# Patient Record
Sex: Female | Born: 1975
Health system: Southern US, Community
[De-identification: ages and names within clinical notes are randomized; demographics above are authoritative.]

## PROBLEM LIST (undated history)

## (undated) DIAGNOSIS — R51 Headache: Secondary | ICD-10-CM

## (undated) DIAGNOSIS — I2699 Other pulmonary embolism without acute cor pulmonale: Secondary | ICD-10-CM

## (undated) DIAGNOSIS — F329 Major depressive disorder, single episode, unspecified: Secondary | ICD-10-CM

## (undated) DIAGNOSIS — F32A Depression, unspecified: Secondary | ICD-10-CM

## (undated) DIAGNOSIS — J45909 Unspecified asthma, uncomplicated: Secondary | ICD-10-CM

## (undated) DIAGNOSIS — Z87442 Personal history of urinary calculi: Secondary | ICD-10-CM

## (undated) DIAGNOSIS — F419 Anxiety disorder, unspecified: Secondary | ICD-10-CM

## (undated) DIAGNOSIS — I82402 Acute embolism and thrombosis of unspecified deep veins of left lower extremity: Secondary | ICD-10-CM

## (undated) HISTORY — PX: OTHER SURGICAL HISTORY: SHX169

## (undated) HISTORY — PX: WISDOM TOOTH EXTRACTION: SHX21

---

## 1998-06-03 ENCOUNTER — Encounter: Admission: RE | Admit: 1998-06-03 | Discharge: 1998-09-01 | Payer: Self-pay | Admitting: Obstetrics and Gynecology

## 1999-05-13 ENCOUNTER — Other Ambulatory Visit: Admission: RE | Admit: 1999-05-13 | Discharge: 1999-05-13 | Payer: Self-pay | Admitting: Obstetrics and Gynecology

## 1999-10-12 ENCOUNTER — Encounter: Admission: RE | Admit: 1999-10-12 | Discharge: 1999-10-12 | Payer: Self-pay | Admitting: Obstetrics and Gynecology

## 2000-01-17 ENCOUNTER — Emergency Department (HOSPITAL_COMMUNITY): Admission: EM | Admit: 2000-01-17 | Discharge: 2000-01-17 | Payer: Self-pay | Admitting: Emergency Medicine

## 2000-03-21 ENCOUNTER — Inpatient Hospital Stay (HOSPITAL_COMMUNITY): Admission: AD | Admit: 2000-03-21 | Discharge: 2000-03-21 | Payer: Self-pay | Admitting: Obstetrics and Gynecology

## 2000-05-11 ENCOUNTER — Encounter: Admission: RE | Admit: 2000-05-11 | Discharge: 2000-05-11 | Payer: Self-pay | Admitting: Obstetrics and Gynecology

## 2000-05-11 ENCOUNTER — Encounter: Payer: Self-pay | Admitting: Obstetrics and Gynecology

## 2000-06-24 ENCOUNTER — Other Ambulatory Visit: Admission: RE | Admit: 2000-06-24 | Discharge: 2000-06-24 | Payer: Self-pay | Admitting: Obstetrics and Gynecology

## 2000-07-22 ENCOUNTER — Emergency Department (HOSPITAL_COMMUNITY): Admission: EM | Admit: 2000-07-22 | Discharge: 2000-07-22 | Payer: Self-pay | Admitting: Emergency Medicine

## 2000-07-22 ENCOUNTER — Encounter: Payer: Self-pay | Admitting: Emergency Medicine

## 2002-10-19 ENCOUNTER — Other Ambulatory Visit: Admission: RE | Admit: 2002-10-19 | Discharge: 2002-10-19 | Payer: Self-pay | Admitting: Obstetrics & Gynecology

## 2003-11-07 ENCOUNTER — Other Ambulatory Visit: Admission: RE | Admit: 2003-11-07 | Discharge: 2003-11-07 | Payer: Self-pay | Admitting: Obstetrics & Gynecology

## 2006-02-12 ENCOUNTER — Emergency Department (HOSPITAL_COMMUNITY): Admission: EM | Admit: 2006-02-12 | Discharge: 2006-02-12 | Payer: Self-pay | Admitting: Family Medicine

## 2006-08-05 ENCOUNTER — Ambulatory Visit: Payer: Self-pay | Admitting: Internal Medicine

## 2007-02-01 ENCOUNTER — Ambulatory Visit: Payer: Self-pay | Admitting: Internal Medicine

## 2007-02-01 LAB — CONVERTED CEMR LAB
ALT: 15 units/L (ref 0–40)
AST: 22 units/L (ref 0–37)
Albumin: 3.5 g/dL (ref 3.5–5.2)
Alkaline Phosphatase: 60 units/L (ref 39–117)
BUN: 5 mg/dL — ABNORMAL LOW (ref 6–23)
Basophils Absolute: 0 10*3/uL (ref 0.0–0.1)
Basophils Relative: 0.5 % (ref 0.0–1.0)
Bilirubin, Direct: 0.1 mg/dL (ref 0.0–0.3)
CO2: 28 meq/L (ref 19–32)
Calcium: 9.4 mg/dL (ref 8.4–10.5)
Chloride: 107 meq/L (ref 96–112)
Cholesterol: 167 mg/dL (ref 0–200)
Creatinine, Ser: 0.9 mg/dL (ref 0.4–1.2)
Eosinophils Absolute: 0.1 10*3/uL (ref 0.0–0.6)
Eosinophils Relative: 1.5 % (ref 0.0–5.0)
GFR calc Af Amer: 93 mL/min
GFR calc non Af Amer: 77 mL/min
Glucose, Bld: 70 mg/dL (ref 70–99)
HCT: 43 % (ref 36.0–46.0)
HDL: 37.6 mg/dL — ABNORMAL LOW (ref >39.0)
Hemoglobin: 14.9 g/dL (ref 12.0–15.0)
LDL Cholesterol: 111 mg/dL — ABNORMAL HIGH (ref 0–99)
Lymphocytes Relative: 20.8 % (ref 12.0–46.0)
MCHC: 34.8 g/dL (ref 30.0–36.0)
MCV: 95.8 fL (ref 78.0–100.0)
Monocytes Absolute: 0.8 10*3/uL — ABNORMAL HIGH (ref 0.2–0.7)
Monocytes Relative: 12.7 % — ABNORMAL HIGH (ref 3.0–11.0)
Neutro Abs: 4.2 10*3/uL (ref 1.4–7.7)
Neutrophils Relative %: 64.5 % (ref 43.0–77.0)
Platelets: 227 10*3/uL (ref 150–400)
Potassium: 3.8 meq/L (ref 3.5–5.1)
RBC: 4.49 M/uL (ref 3.87–5.11)
RDW: 12.9 % (ref 11.5–14.6)
Sodium: 145 meq/L (ref 135–145)
TSH: 2.33 microintl units/mL (ref 0.35–5.50)
Total Bilirubin: 0.5 mg/dL (ref 0.3–1.2)
Total CHOL/HDL Ratio: 4.4
Total Protein: 7.1 g/dL (ref 6.0–8.3)
Triglycerides: 91 mg/dL (ref 0–149)
VLDL: 18 mg/dL (ref 0–40)
WBC: 6.4 10*3/uL (ref 4.5–10.5)

## 2007-02-08 ENCOUNTER — Ambulatory Visit: Payer: Self-pay | Admitting: Internal Medicine

## 2007-02-09 ENCOUNTER — Encounter: Admission: RE | Admit: 2007-02-09 | Discharge: 2007-05-10 | Payer: Self-pay | Admitting: Internal Medicine

## 2007-03-21 ENCOUNTER — Ambulatory Visit (HOSPITAL_COMMUNITY): Admission: RE | Admit: 2007-03-21 | Discharge: 2007-03-21 | Payer: Self-pay | Admitting: Surgery

## 2007-03-27 ENCOUNTER — Ambulatory Visit (HOSPITAL_COMMUNITY): Admission: RE | Admit: 2007-03-27 | Discharge: 2007-03-27 | Payer: Self-pay | Admitting: Surgery

## 2007-04-10 ENCOUNTER — Ambulatory Visit: Payer: Self-pay | Admitting: Internal Medicine

## 2007-05-12 ENCOUNTER — Emergency Department (HOSPITAL_COMMUNITY): Admission: EM | Admit: 2007-05-12 | Discharge: 2007-05-12 | Payer: Self-pay | Admitting: Emergency Medicine

## 2007-05-24 ENCOUNTER — Encounter: Admission: RE | Admit: 2007-05-24 | Discharge: 2007-05-24 | Payer: Self-pay | Admitting: Orthopaedic Surgery

## 2007-08-31 DIAGNOSIS — R51 Headache: Secondary | ICD-10-CM

## 2007-08-31 DIAGNOSIS — R519 Headache, unspecified: Secondary | ICD-10-CM | POA: Insufficient documentation

## 2007-08-31 DIAGNOSIS — F329 Major depressive disorder, single episode, unspecified: Secondary | ICD-10-CM

## 2007-09-18 ENCOUNTER — Encounter: Admission: RE | Admit: 2007-09-18 | Discharge: 2007-12-17 | Payer: Self-pay | Admitting: Internal Medicine

## 2007-09-22 ENCOUNTER — Encounter: Payer: Self-pay | Admitting: Internal Medicine

## 2007-10-02 ENCOUNTER — Ambulatory Visit (HOSPITAL_COMMUNITY): Admission: RE | Admit: 2007-10-02 | Discharge: 2007-10-03 | Payer: Self-pay | Admitting: Surgery

## 2007-10-27 DIAGNOSIS — J069 Acute upper respiratory infection, unspecified: Secondary | ICD-10-CM | POA: Insufficient documentation

## 2007-11-03 ENCOUNTER — Ambulatory Visit: Payer: Self-pay | Admitting: Family Medicine

## 2007-11-03 DIAGNOSIS — H66009 Acute suppurative otitis media without spontaneous rupture of ear drum, unspecified ear: Secondary | ICD-10-CM | POA: Insufficient documentation

## 2008-01-01 ENCOUNTER — Encounter: Admission: RE | Admit: 2008-01-01 | Discharge: 2008-01-01 | Payer: Self-pay | Admitting: Internal Medicine

## 2008-07-04 ENCOUNTER — Ambulatory Visit: Payer: Self-pay | Admitting: *Deleted

## 2008-08-06 ENCOUNTER — Telehealth: Payer: Self-pay | Admitting: Internal Medicine

## 2008-08-06 ENCOUNTER — Ambulatory Visit: Payer: Self-pay | Admitting: Internal Medicine

## 2008-08-06 DIAGNOSIS — J0110 Acute frontal sinusitis, unspecified: Secondary | ICD-10-CM | POA: Insufficient documentation

## 2008-08-15 ENCOUNTER — Telehealth: Payer: Self-pay | Admitting: Internal Medicine

## 2008-11-01 ENCOUNTER — Emergency Department (HOSPITAL_COMMUNITY): Admission: EM | Admit: 2008-11-01 | Discharge: 2008-11-01 | Payer: Self-pay | Admitting: Emergency Medicine

## 2011-01-17 ENCOUNTER — Encounter: Payer: Self-pay | Admitting: Surgery

## 2011-02-25 DIAGNOSIS — I82402 Acute embolism and thrombosis of unspecified deep veins of left lower extremity: Secondary | ICD-10-CM

## 2011-02-25 HISTORY — DX: Acute embolism and thrombosis of unspecified deep veins of left lower extremity: I82.402

## 2011-05-11 NOTE — Op Note (Signed)
Stacey Myers, ROSASCO                   ACCOUNT NO.:  [id number]   MEDICAL RECORD NO.:  [id number]          PATIENT TYPE:  OIB   LOCATION:  0098                         FACILITY:  [facility]   PHYSICIAN:  Sandria Bales. Ezzard Standing, M.D.  DATE OF BIRTH:  09/07/1976   DATE OF PROCEDURE:  10/02/2007  DATE OF DISCHARGE:                               OPERATIVE REPORT   PREOPERATIVE DIAGNOSIS:  Morbid obesity (weight 268, body mass index  42.1).   POSTOPERATIVE DIAGNOSIS:  Morbid obesity (weight 268, body mass index  42.1).   PROCEDURE:  A lap band with APS standard band.   SURGEON:  Sandria Bales. Ezzard Standing, M.D.   FIRST ASSISTANT:  Dr. Jaclynn Guarneri.   ANESTHESIA:  General endotracheal.   BLOOD LOSS:  Minimal.   COMPLICATIONS:  None.   PROCEDURE:  The patient is a 35 year old white female who sees Bruce H.  Swords, MD, as her primary medical physician and Marlinda Mike at  [facility].   She has been morbidly obese much of her adult life, has been through our  preop bariatric program and now comes for attempted laparoscopic  banding.   Indications, potential complications explained to the patient.  Potential complications include but not limited to bleeding, infection,  bowel injury and long-term nutritional consequences.   OPERATIVE NOTE:  Patient placed in supine position, given 1 gram of  Ancef at the initiation of the procedure.  She had PAS stockings in  place.  Her abdomen was prepped with Betadine solution and sterilely  draped.   I accessed the abdomen with an OptiVu in the left upper quadrant and  insufflated the abdomen.   I carried out a laparoscopic exploration which were right left lobes of  liver unremarkable.  The stomach was unremarkable.  The bowel that I  could see was unremarkable.  I then placed five additional trocars, a 5  mm subxiphoid port, a 15 mm right subcostal port, a 12 mm right  paramedian port, a 12 mm left paramedian port and a 5 mm left lateral  abdominal  port.   I then placed the Nathanson retractor through the subxiphoid port and  retracted the left lobe of the liver.   I then started my dissection up at the gastroesophageal junction.  She  had really no external evidence of a hiatal hernia.  I expose the angle  of Hiss.  I then went through the gastrohepatic ligament and identified  the right crus and passed the finger dissector behind the stomach up to  the angle of Hiss.  I then placed the band around the proximal stomach,  a sizer was passed down the esophagus into the stomach and I cinched the  APS band around the sizer easily.   I then removed the sizer.  I imbricated the stomach over the band with  three sutures of 0 Ethibond suture bringing the stomach up over the band  to attach to the stomach right at the GE junction.   The band seated well.  There was no bleeding or evidence of injury to  the stomach.  I then removed the Silastic tubing through the right  paramedian port and all of the other ports were removed under direct  visualization without any evidence of bleeding.  The Nathanson retractor  had already been removed.   I then enlarged the right paramedian incision and cut down to the  abdominal wall.  I attached the Silastic tubing to the reservoir device  and then sewed the reservoir into the anterior abdominal wall using 0  Prolene sutures.   The wound was then irrigated.  The subcutaneous tissues closed with 3-0  Vicryl suture.  The skin site was closed with a 5-0 Monocryl suture and  then Dermabond was placed on the incisions.   The patient has tolerated the procedure well, was transported to the  recovery room in good condition.  Sponge and needle count were correct  at the end of the case.      Sandria Bales. Ezzard Standing, M.D.  Electronically Signed     DHN/MEDQ  D:  10/02/2007  T:  10/02/2007  Job:  [id number]   cc:   Marlinda Mike, C.N.M.   Bruce Rexene Edison Swords, MD  [address] [place]  [place]  [address]

## 2011-10-07 LAB — BASIC METABOLIC PANEL
CO2: 23
Chloride: 108
GFR calc Af Amer: >60
Glucose, Bld: 90
Sodium: 139

## 2011-10-07 LAB — DIFFERENTIAL
Basophils Absolute: 0
Lymphocytes Relative: 15
Lymphs Abs: 1.8
Monocytes Absolute: 0.8 — ABNORMAL HIGH
Monocytes Relative: 7
Neutro Abs: 9 — ABNORMAL HIGH

## 2011-10-07 LAB — CBC
Hemoglobin: 13.6
RBC: 4.08
WBC: 11.7 — ABNORMAL HIGH

## 2011-10-07 LAB — PREGNANCY, URINE: Preg Test, Ur: NEGATIVE

## 2011-10-07 LAB — HEMOGLOBIN AND HEMATOCRIT, BLOOD: HCT: 42.3

## 2012-05-04 ENCOUNTER — Ambulatory Visit
Admission: RE | Admit: 2012-05-04 | Discharge: 2012-05-04 | Disposition: A | Discharge status: Home / Self Care | Payer: Medicaid Other | Source: Ambulatory Visit | Attending: Family Medicine | Admitting: Family Medicine

## 2012-05-04 ENCOUNTER — Other Ambulatory Visit: Payer: Self-pay | Admitting: Family Medicine

## 2012-05-04 DIAGNOSIS — M25569 Pain in unspecified knee: Secondary | ICD-10-CM

## 2012-05-15 ENCOUNTER — Emergency Department (HOSPITAL_COMMUNITY)
Admission: EM | Admit: 2012-05-15 | Discharge: 2012-05-15 | Disposition: A | Discharge status: Home / Self Care | Payer: Self-pay | Source: Home / Self Care | Attending: Emergency Medicine | Admitting: Emergency Medicine

## 2012-05-15 ENCOUNTER — Encounter (HOSPITAL_COMMUNITY): Payer: Self-pay

## 2012-05-15 DIAGNOSIS — R05 Cough: Secondary | ICD-10-CM

## 2012-05-15 HISTORY — DX: Other pulmonary embolism without acute cor pulmonale: I26.99

## 2012-05-15 HISTORY — DX: Acute embolism and thrombosis of unspecified deep veins of left lower extremity: I82.402

## 2012-05-15 MED ORDER — PREDNISONE 20 MG PO TABS: 40.0000 mg | ORAL_TABLET | Freq: Every day | ORAL | Status: AC

## 2012-05-15 MED ORDER — ALBUTEROL SULFATE HFA 108 (90 BASE) MCG/ACT IN AERS: 1.0000 | INHALATION_SPRAY | Freq: Four times a day (QID) | RESPIRATORY_TRACT | Status: DC | PRN

## 2012-05-15 NOTE — ED Provider Notes (Addendum)
History     CSN: [id number]  Arrival date & time 05/15/12  1109   First MD Initiated Contact with Patient 05/15/12 1116      Chief Complaint  Patient presents with  . URI    (Consider location/radiation/quality/duration/timing/severity/associated sxs/prior treatment) HPI Comments: Patient presents urgent care she's been coughing for 3 days feels congested. No fevers and no shortness of breath. Cough is constant occasionally see some phlegm but most of time is dry in his bothersome at this point. Patient denies any chest pains, or shortness of breath or wheezing. Does feel some nasal congestion. Some discomfort in her throat thinks that is secondary to coughing.  Patient is a 36 y.o. female presenting with URI. The history is provided by the patient.  URI The primary symptoms include sore throat and cough. Primary symptoms do not include fatigue, headaches, ear pain, swollen glands, wheezing, nausea, vomiting, myalgias, arthralgias or rash.  Symptoms associated with the illness include facial pain and rhinorrhea. The illness is not associated with chills.    Past Medical History  Diagnosis Date  . Pulmonary embolism   . Deep vein blood clot of left lower extremity     History reviewed. No pertinent past surgical history.  History reviewed. No pertinent family history.  History  Substance Use Topics  . Smoking status: Unknown If Ever Smoked  . Smokeless tobacco: Not on file  . Alcohol Use: No    OB History    Grav Para Term Preterm Abortions TAB SAB Ect Mult Living                  Review of Systems  Constitutional: Negative for chills, activity change, appetite change and fatigue.  HENT: Positive for sore throat and rhinorrhea. Negative for ear pain.   Respiratory: Positive for cough. Negative for shortness of breath and wheezing.   Gastrointestinal: Negative for nausea and vomiting.  Musculoskeletal: Negative for myalgias and arthralgias.  Skin: Negative for rash.    Neurological: Negative for headaches.    Allergies  Azithromycin; Erythromycin; Penicillins; and Sulfamethoxazole w-trimethoprim  Home Medications   Current Outpatient Rx  Name Route Sig Dispense Refill  . WARFARIN SODIUM 5 MG PO TABS Oral Take 5 mg by mouth daily.    . ALBUTEROL SULFATE HFA 108 (90 BASE) MCG/ACT IN AERS Inhalation Inhale 1-2 puffs into the lungs every 6 (six) hours as needed for wheezing. 1 Inhaler 0  . PREDNISONE 20 MG PO TABS Oral Take 2 tablets (40 mg total) by mouth daily. 2 tablets daily for 5 days 10 tablet 0    LMP 04/19/2012  Physical Exam  Nursing note and vitals reviewed. Constitutional: She is oriented to person, place, and time. She appears well-developed and well-nourished.  Non-toxic appearance. She does not have a sickly appearance. She does not appear ill. No distress.  HENT:  Head: Normocephalic.  Right Ear: Tympanic membrane normal.  Left Ear: Tympanic membrane normal.  Mouth/Throat: Uvula is midline, oropharynx is clear and moist and mucous membranes are normal.  Eyes: Conjunctivae are normal. Right eye exhibits no discharge. Left eye exhibits no discharge.  Neck: Neck supple. No JVD present.  Cardiovascular: Normal rate.   Pulmonary/Chest: Effort normal. No respiratory distress. She has no decreased breath sounds. She has no wheezes. She has no rales. She exhibits no tenderness.  Abdominal: Soft.  Musculoskeletal: Normal range of motion.  Lymphadenopathy:    She has no cervical adenopathy.  Neurological: She is oriented to person, place, and time.  Skin: Skin is warm.    ED Course  Procedures (including critical care time)  Labs Reviewed - No data to display No results found.   1. Cough       MDM  Patient has been considering changing to a new primary care Dr. as she feels she wasn't discontinue Coumadin. She does currently to the [facility].       Jimmie Molly, MD 05/15/12 1535  Jimmie Molly, MD 05/15/12 1540

## 2012-05-15 NOTE — ED Notes (Signed)
Cold syx; seen by MD only

## 2012-05-15 NOTE — Discharge Instructions (Signed)
  Take these medicines for this reactive cough, along with an antihistamine such as Zyrtec, Allegra or Claritin.   Cough, Adult  A cough is a reflex. It helps you clear your throat and airways. A cough can help heal your body. A cough can last 2 or 3 weeks (acute) or may last more than 8 weeks (chronic). Some common causes of a cough can include an infection, allergy, or a cold. HOME CARE  Only take medicine as told by your doctor.   If given, take your medicines (antibiotics) as told. Finish them even if you start to feel better.   Use a cold steam vaporizer or humidier in your home. This can help loosen thick spit (secretions).   Sleep so you are almost sitting up (semi-upright). Use pillows to do this. This helps reduce coughing.   Rest as needed.   Stop smoking if you smoke.  GET HELP RIGHT AWAY IF:  You have yellowish-white fluid (pus) in your thick spit.   Your cough gets worse.   Your medicine does not reduce coughing, and you are losing sleep.   You cough up blood.   You have trouble breathing.   Your pain gets worse and medicine does not help.   You have a fever.  MAKE SURE YOU:   Understand these instructions.   Will watch your condition.   Will get help right away if you are not doing well or get worse.  Document Released: 08/26/2011 Document Revised: 12/02/2011 Document Reviewed: 08/26/2011 [facility] Patient Information 2012 [place], [place].

## 2012-08-09 ENCOUNTER — Telehealth: Payer: Self-pay | Admitting: *Deleted

## 2012-08-09 NOTE — Telephone Encounter (Signed)
patient stated on 08-09-2012 that she will be going to [facility] she found a hemtaologist [place] [address] [place]

## 2012-09-26 ENCOUNTER — Encounter (HOSPITAL_COMMUNITY): Payer: Self-pay | Admitting: Pharmacist

## 2012-10-03 ENCOUNTER — Other Ambulatory Visit: Payer: Self-pay | Admitting: Obstetrics & Gynecology

## 2012-10-03 ENCOUNTER — Encounter (HOSPITAL_COMMUNITY): Payer: Self-pay

## 2012-10-03 ENCOUNTER — Encounter (HOSPITAL_COMMUNITY)
Admission: RE | Admit: 2012-10-03 | Discharge: 2012-10-03 | Disposition: A | Discharge status: Home / Self Care | Payer: BC Managed Care – PPO | Source: Ambulatory Visit | Attending: Obstetrics & Gynecology | Admitting: Obstetrics & Gynecology

## 2012-10-03 DIAGNOSIS — Z01818 Encounter for other preprocedural examination: Secondary | ICD-10-CM | POA: Insufficient documentation

## 2012-10-03 DIAGNOSIS — Z01812 Encounter for preprocedural laboratory examination: Secondary | ICD-10-CM | POA: Insufficient documentation

## 2012-10-03 HISTORY — DX: Major depressive disorder, single episode, unspecified: F32.9

## 2012-10-03 HISTORY — DX: Anxiety disorder, unspecified: F41.9

## 2012-10-03 HISTORY — DX: Unspecified asthma, uncomplicated: J45.909

## 2012-10-03 HISTORY — DX: Depression, unspecified: F32.A

## 2012-10-03 HISTORY — DX: Headache: R51

## 2012-10-03 LAB — CBC
HCT: 41.6 % (ref 36.0–46.0)
Hemoglobin: 13.9 g/dL (ref 12.0–15.0)
MCV: 93.7 fL (ref 78.0–100.0)
Platelets: 220 10*3/uL (ref 150–400)
RBC: 4.44 MIL/uL (ref 3.87–5.11)
WBC: 7.2 10*3/uL (ref 4.0–10.5)

## 2012-10-03 NOTE — Patient Instructions (Addendum)
   Your procedure is scheduled on:  Thursday, Oct. 17   Enter through the [employer] of [facility] at:  1015 am Pick up the phone at the desk and dial [phone] and inform us of your arrival.  Please call this number if you have any problems the morning of surgery: [phone]  Remember: Do not eat food after midnight: Wednesday Do not drink clear liquids after:730 am Thursday Take these medicines the morning of surgery with a SIP OF WATER:  None.  Patient to bring rescue inhaler with her on day of surgery.  Per patient, Dr Seymour Bars instructed patient to stop Coumadin on Wed, 10/11/12.   Do not wear jewelry, make-up, or FINGER nail polish No metal in your hair or on your body. Do not wear lotions, powders, perfumes. You may wear deodorant.  Please use your CHG wash as directed prior to surgery.  Do not shave anywhere for at least 12 hours prior to first CHG shower.  Do not bring valuables to the hospital. Contacts, dentures or bridgework may not be worn into surgery.  Patients discharged on the day of surgery will not be allowed to drive home.   Home with mother Ripley Lovecchio.

## 2012-10-12 ENCOUNTER — Ambulatory Visit (HOSPITAL_COMMUNITY)
Admission: RE | Admit: 2012-10-12 | Payer: BC Managed Care – PPO | Source: Ambulatory Visit | Admitting: Obstetrics & Gynecology

## 2012-10-12 ENCOUNTER — Encounter (HOSPITAL_COMMUNITY): Admission: RE | Payer: Self-pay | Source: Ambulatory Visit

## 2012-10-12 SURGERY — DILATATION & CURETTAGE/HYSTEROSCOPY WITH NOVASURE ABLATION
Anesthesia: Choice

## 2013-04-30 ENCOUNTER — Encounter (HOSPITAL_COMMUNITY)
Admission: RE | Admit: 2013-04-30 | Discharge: 2013-04-30 | Disposition: A | Discharge status: Home / Self Care | Payer: BC Managed Care – PPO | Source: Ambulatory Visit | Attending: Obstetrics & Gynecology | Admitting: Obstetrics & Gynecology

## 2013-04-30 ENCOUNTER — Encounter (HOSPITAL_COMMUNITY): Payer: Self-pay

## 2013-04-30 HISTORY — DX: Personal history of urinary calculi: Z87.442

## 2013-04-30 LAB — CBC
HCT: 41.5 % (ref 36.0–46.0)
Hemoglobin: 14.4 g/dL (ref 12.0–15.0)
MCHC: 34.7 g/dL (ref 30.0–36.0)

## 2013-04-30 LAB — SURGICAL PCR SCREEN: Staphylococcus aureus: NEGATIVE

## 2013-04-30 NOTE — Patient Instructions (Signed)
Your procedure is scheduled on:05/04/13  Enter through the Main Entrance at :0730 am  Pick up desk phone and dial [phone] and inform us of your arrival.  Please call [phone] if you have any problems the morning of surgery.  Remember: Do not eat or drink after midnight:Thursday  Bring inhaler to hospital  DO NOT wear jewelry, eye make-up, lipstick,body lotion, or dark fingernail polish.   If you are to be admitted after surgery, leave suitcase in car until your room has been assigned. Patients discharged on the day of surgery will not be allowed to drive home.

## 2013-05-01 ENCOUNTER — Other Ambulatory Visit: Payer: Self-pay | Admitting: Obstetrics & Gynecology

## 2013-05-03 MED ORDER — GENTAMICIN SULFATE 40 MG/ML IJ SOLN
INTRAVENOUS | Status: AC
  Administered 2013-05-04: 100 mL via INTRAVENOUS
  Filled 2013-05-03: qty 9.5

## 2013-05-04 ENCOUNTER — Encounter (HOSPITAL_COMMUNITY): Payer: Self-pay | Admitting: Anesthesiology

## 2013-05-04 ENCOUNTER — Encounter (HOSPITAL_COMMUNITY)
Admission: RE | Disposition: A | Discharge status: Home / Self Care | Payer: Self-pay | Source: Ambulatory Visit | Attending: Obstetrics & Gynecology

## 2013-05-04 ENCOUNTER — Ambulatory Visit (HOSPITAL_COMMUNITY)
Admission: RE | Admit: 2013-05-04 | Discharge: 2013-05-04 | Disposition: A | Discharge status: Home / Self Care | Payer: BC Managed Care – PPO | Source: Ambulatory Visit | Attending: Obstetrics & Gynecology | Admitting: Obstetrics & Gynecology

## 2013-05-04 ENCOUNTER — Encounter (HOSPITAL_COMMUNITY): Payer: Self-pay | Admitting: *Deleted

## 2013-05-04 ENCOUNTER — Ambulatory Visit (HOSPITAL_COMMUNITY): Payer: BC Managed Care – PPO | Admitting: Anesthesiology

## 2013-05-04 ENCOUNTER — Encounter (HOSPITAL_COMMUNITY): Payer: Self-pay | Admitting: Pharmacy Technician

## 2013-05-04 DIAGNOSIS — Z86718 Personal history of other venous thrombosis and embolism: Secondary | ICD-10-CM | POA: Insufficient documentation

## 2013-05-04 DIAGNOSIS — Z86711 Personal history of pulmonary embolism: Secondary | ICD-10-CM | POA: Insufficient documentation

## 2013-05-04 DIAGNOSIS — Z302 Encounter for sterilization: Secondary | ICD-10-CM | POA: Insufficient documentation

## 2013-05-04 DIAGNOSIS — N920 Excessive and frequent menstruation with regular cycle: Secondary | ICD-10-CM | POA: Insufficient documentation

## 2013-05-04 HISTORY — PX: LAPAROSCOPIC TUBAL LIGATION: SHX1937

## 2013-05-04 HISTORY — PX: DILITATION & CURRETTAGE/HYSTROSCOPY WITH NOVASURE ABLATION: SHX5568

## 2013-05-04 LAB — CBC
HCT: 37.7 % (ref 36.0–46.0)
Hemoglobin: 12.9 g/dL (ref 12.0–15.0)
MCH: 31.9 pg (ref 26.0–34.0)
MCHC: 34.2 g/dL (ref 30.0–36.0)
MCV: 93.1 fL (ref 78.0–100.0)

## 2013-05-04 LAB — COMPREHENSIVE METABOLIC PANEL
BUN: 8 mg/dL (ref 6–23)
Calcium: 8.5 mg/dL (ref 8.4–10.5)
Creatinine, Ser: 0.75 mg/dL (ref 0.50–1.10)
GFR calc Af Amer: >90 mL/min (ref >90)
Glucose, Bld: 92 mg/dL (ref 70–99)
Sodium: 138 mEq/L (ref 135–145)
Total Protein: 6.3 g/dL (ref 6.0–8.3)

## 2013-05-04 LAB — PREGNANCY, URINE: Preg Test, Ur: NEGATIVE

## 2013-05-04 LAB — PROTIME-INR
INR: 0.97 (ref 0.00–1.49)
Prothrombin Time: 12.8 seconds (ref 11.6–15.2)

## 2013-05-04 LAB — TYPE AND SCREEN: ABO/RH(D): A NEG

## 2013-05-04 SURGERY — DILATATION & CURETTAGE/HYSTEROSCOPY WITH NOVASURE ABLATION
Anesthesia: General | Site: Vagina | Wound class: Clean Contaminated

## 2013-05-04 MED ORDER — MIDAZOLAM HCL 2 MG/2ML IJ SOLN: 0.5000 mg | Freq: Once | INTRAMUSCULAR | Status: DC | PRN

## 2013-05-04 MED ORDER — GLYCOPYRROLATE 0.2 MG/ML IJ SOLN
INTRAMUSCULAR | Status: AC
  Filled 2013-05-04: qty 4

## 2013-05-04 MED ORDER — LACTATED RINGERS IV SOLN
Freq: Once | INTRAVENOUS | Status: AC
  Administered 2013-05-04: 50 mL/h via INTRAVENOUS

## 2013-05-04 MED ORDER — PROPOFOL 10 MG/ML IV BOLUS
INTRAVENOUS | Status: DC | PRN
  Administered 2013-05-04: 170 mg via INTRAVENOUS

## 2013-05-04 MED ORDER — LIDOCAINE HCL (CARDIAC) 20 MG/ML IV SOLN
INTRAVENOUS | Status: AC
  Filled 2013-05-04: qty 5

## 2013-05-04 MED ORDER — MEPERIDINE HCL 25 MG/ML IJ SOLN: 6.2500 mg | INTRAMUSCULAR | Status: DC | PRN

## 2013-05-04 MED ORDER — ROCURONIUM BROMIDE 100 MG/10ML IV SOLN
INTRAVENOUS | Status: DC | PRN
  Administered 2013-05-04: 30 mg via INTRAVENOUS
  Administered 2013-05-04: 5 mg via INTRAVENOUS

## 2013-05-04 MED ORDER — KETOROLAC TROMETHAMINE 30 MG/ML IJ SOLN: 15.0000 mg | Freq: Once | INTRAMUSCULAR | Status: AC | PRN

## 2013-05-04 MED ORDER — ROCURONIUM BROMIDE 50 MG/5ML IV SOLN
INTRAVENOUS | Status: AC
  Filled 2013-05-04: qty 1

## 2013-05-04 MED ORDER — FENTANYL CITRATE 0.05 MG/ML IJ SOLN
INTRAMUSCULAR | Status: AC
  Filled 2013-05-04: qty 5

## 2013-05-04 MED ORDER — OXYCODONE-ACETAMINOPHEN 7.5-325 MG PO TABS: 1.0000 | ORAL_TABLET | ORAL | Status: DC | PRN

## 2013-05-04 MED ORDER — HEPARIN SODIUM (PORCINE) 5000 UNIT/ML IJ SOLN
INTRAMUSCULAR | Status: AC
  Administered 2013-05-04: 5000 [IU] via SUBCUTANEOUS
  Filled 2013-05-04: qty 1

## 2013-05-04 MED ORDER — MIDAZOLAM HCL 5 MG/5ML IJ SOLN
INTRAMUSCULAR | Status: DC | PRN
  Administered 2013-05-04: 2 mg via INTRAVENOUS

## 2013-05-04 MED ORDER — BUPIVACAINE HCL (PF) 0.25 % IJ SOLN
INTRAMUSCULAR | Status: AC
  Filled 2013-05-04: qty 30

## 2013-05-04 MED ORDER — DEXAMETHASONE SODIUM PHOSPHATE 10 MG/ML IJ SOLN
INTRAMUSCULAR | Status: AC
  Filled 2013-05-04: qty 1

## 2013-05-04 MED ORDER — NEOSTIGMINE METHYLSULFATE 1 MG/ML IJ SOLN
INTRAMUSCULAR | Status: AC
  Filled 2013-05-04: qty 1

## 2013-05-04 MED ORDER — KETOROLAC TROMETHAMINE 30 MG/ML IJ SOLN
INTRAMUSCULAR | Status: AC
  Administered 2013-05-04: 30 mg via INTRAVENOUS
  Filled 2013-05-04: qty 1

## 2013-05-04 MED ORDER — CHLOROPROCAINE HCL 1 % IJ SOLN
INTRAMUSCULAR | Status: AC
  Filled 2013-05-04: qty 30

## 2013-05-04 MED ORDER — GLYCOPYRROLATE 0.2 MG/ML IJ SOLN
INTRAMUSCULAR | Status: AC
  Filled 2013-05-04: qty 2

## 2013-05-04 MED ORDER — ONDANSETRON HCL 4 MG/2ML IJ SOLN
INTRAMUSCULAR | Status: DC | PRN
  Administered 2013-05-04: 4 mg via INTRAVENOUS

## 2013-05-04 MED ORDER — MIDAZOLAM HCL 2 MG/2ML IJ SOLN
INTRAMUSCULAR | Status: AC
  Filled 2013-05-04: qty 2

## 2013-05-04 MED ORDER — ONDANSETRON HCL 4 MG/2ML IJ SOLN
INTRAMUSCULAR | Status: AC
  Filled 2013-05-04: qty 2

## 2013-05-04 MED ORDER — GLYCOPYRROLATE 0.2 MG/ML IJ SOLN
INTRAMUSCULAR | Status: DC | PRN
  Administered 2013-05-04: 0.1 mg via INTRAVENOUS
  Administered 2013-05-04: 0.6 mg via INTRAVENOUS

## 2013-05-04 MED ORDER — PROPOFOL 10 MG/ML IV EMUL
INTRAVENOUS | Status: AC
  Filled 2013-05-04: qty 20

## 2013-05-04 MED ORDER — LACTATED RINGERS IV SOLN
INTRAVENOUS | Status: DC | PRN
  Administered 2013-05-04 (×3): via INTRAVENOUS

## 2013-05-04 MED ORDER — PROMETHAZINE HCL 25 MG/ML IJ SOLN
INTRAMUSCULAR | Status: AC
  Administered 2013-05-04: 12.5 mg via INTRAVENOUS
  Filled 2013-05-04: qty 1

## 2013-05-04 MED ORDER — BUPIVACAINE HCL (PF) 0.25 % IJ SOLN
INTRAMUSCULAR | Status: DC | PRN
  Administered 2013-05-04: 5 mL

## 2013-05-04 MED ORDER — CHLOROPROCAINE HCL 1 % IJ SOLN
INTRAMUSCULAR | Status: DC | PRN
  Administered 2013-05-04: 20 mL

## 2013-05-04 MED ORDER — FENTANYL CITRATE 0.05 MG/ML IJ SOLN: 25.0000 ug | INTRAMUSCULAR | Status: DC | PRN

## 2013-05-04 MED ORDER — DEXAMETHASONE SODIUM PHOSPHATE 10 MG/ML IJ SOLN
INTRAMUSCULAR | Status: DC | PRN
  Administered 2013-05-04: 10 mg via INTRAVENOUS

## 2013-05-04 MED ORDER — FENTANYL CITRATE 0.05 MG/ML IJ SOLN
INTRAMUSCULAR | Status: DC | PRN
  Administered 2013-05-04: 50 ug via INTRAVENOUS
  Administered 2013-05-04: 100 ug via INTRAVENOUS
  Administered 2013-05-04 (×2): 50 ug via INTRAVENOUS

## 2013-05-04 MED ORDER — HEPARIN SODIUM (PORCINE) 5000 UNIT/ML IJ SOLN: 5000.0000 [IU] | INTRAMUSCULAR | Status: AC

## 2013-05-04 MED ORDER — PROPOFOL 10 MG/ML IV BOLUS: INTRAVENOUS | Status: DC | PRN

## 2013-05-04 MED ORDER — LIDOCAINE HCL (CARDIAC) 20 MG/ML IV SOLN
INTRAVENOUS | Status: DC | PRN
  Administered 2013-05-04: 80 mg via INTRAVENOUS

## 2013-05-04 MED ORDER — NEOSTIGMINE METHYLSULFATE 1 MG/ML IJ SOLN
INTRAMUSCULAR | Status: DC | PRN
  Administered 2013-05-04: 3 mg via INTRAVENOUS

## 2013-05-04 MED ORDER — PROMETHAZINE HCL 25 MG/ML IJ SOLN: 6.2500 mg | INTRAMUSCULAR | Status: DC | PRN

## 2013-05-04 SURGICAL SUPPLY — 23 items
ABLATOR ENDOMETRIAL BIPOLAR (ABLATOR) ×3 IMPLANT
APPLICATOR COTTON TIP 6IN STRL (MISCELLANEOUS) ×3 IMPLANT
CATH ROBINSON RED A/P 16FR (CATHETERS) ×3 IMPLANT
CLOTH BEACON ORANGE TIMEOUT ST (SAFETY) ×3 IMPLANT
CONTAINER PREFILL 10% NBF 60ML (FORM) ×3 IMPLANT
DRESSING TELFA 8X3 (GAUZE/BANDAGES/DRESSINGS) ×3 IMPLANT
ELECT REM PT RETURN 9FT ADLT (ELECTROSURGICAL) ×3
ELECTRODE REM PT RTRN 9FT ADLT (ELECTROSURGICAL) ×2 IMPLANT
GLOVE BIO SURGEON STRL SZ 6.5 (GLOVE) ×3 IMPLANT
GLOVE BIOGEL PI IND STRL 7.0 (GLOVE) ×2 IMPLANT
GLOVE BIOGEL PI INDICATOR 7.0 (GLOVE) ×1
GOWN PREVENTION PLUS LG XLONG (DISPOSABLE) ×6 IMPLANT
GOWN STRL REIN XL XLG (GOWN DISPOSABLE) ×9 IMPLANT
PACK HYSTEROSCOPY LF (CUSTOM PROCEDURE TRAY) ×3 IMPLANT
PACK LAPAROSCOPY BASIN (CUSTOM PROCEDURE TRAY) ×3 IMPLANT
PAD OB MATERNITY 4.3X12.25 (PERSONAL CARE ITEMS) ×3 IMPLANT
PENCIL BUTTON HOLSTER BLD 10FT (ELECTRODE) ×3 IMPLANT
SUT MNCRL AB 4-0 PS2 18 (SUTURE) ×3 IMPLANT
SUT VICRYL 0 UR6 27IN ABS (SUTURE) ×3 IMPLANT
TOWEL OR 17X24 6PK STRL BLUE (TOWEL DISPOSABLE) ×6 IMPLANT
TROCAR BALLN 12MMX100 BLUNT (TROCAR) ×3 IMPLANT
WARMER LAPAROSCOPE (MISCELLANEOUS) ×3 IMPLANT
WATER STERILE IRR 1000ML POUR (IV SOLUTION) ×3 IMPLANT

## 2013-05-04 NOTE — Discharge Summary (Signed)
  Physician Discharge Summary  Patient ID: Stacey Myers MRN: [id number] DOB/AGE: 1976/02/17 37 y.o.  Admit date: 05/04/2013 Discharge date: 05/04/2013  Admission Diagnoses: Desires Sterilization, Menorrhagia  [address], [phone]  Discharge Diagnoses: Desires Sterilization, Menorrhagia  82956, [phone]        Active Problems:   * No active hospital problems. *   Discharged Condition: good  Hospital Course: Outpatient  Consults: None  Treatments: surgery: D+C, Novasure endometrial ablation, LPS bilateral tubal sterilization by cautherization.  Disposition: 01-Home or Self Care     Medication List    TAKE these medications       ADULT GUMMY Chew  Chew 2 tablets by mouth daily.     aspirin 325 MG tablet  Take 325 mg by mouth daily.     Calcium Carb-Cholecalciferol 500-400 MG-UNIT Chew  Chew 2 each by mouth daily.     oxyCODONE-acetaminophen 7.5-325 MG per tablet  Commonly known as:  PERCOCET  Take 1 tablet by mouth every 4 (four) hours as needed for pain.           Follow-up Information   Follow up with Bobbijo Holst,MARIE-LYNE, MD In 3 weeks.   Contact information:   [address] [place] [address] [phone]       Signed: Genia Del, MD 05/04/2013, 11:51 AM

## 2013-05-04 NOTE — Transfer of Care (Signed)
Immediate Anesthesia Transfer of Care Note  Patient: Stacey Myers  Procedure(s) Performed: Procedure(s): DILATATION & CURETTAGE/HYSTEROSCOPY WITH NOVASURE ABLATION (N/A) LAPAROSCOPIC TUBAL LIGATION (Bilateral)  Patient Location: PACU  Anesthesia Type:General  Level of Consciousness: awake, alert  and oriented  Airway & Oxygen Therapy: Patient Spontanous Breathing and Patient connected to nasal cannula oxygen  Post-op Assessment: Report given to PACU RN and Post -op Vital signs reviewed and stable  Post vital signs: Reviewed and stable  Complications: No apparent anesthesia complications

## 2013-05-04 NOTE — H&P (Signed)
Stacey Myers is an 37 y.o. female   RP:  D+C Novasure endometrial ablation, LPS BT/S cautherization  Pertinent Gynecological History: Menses: flow is excessive with use of many pads or tampons on heaviest days Bleeding: no BTB Contraception: condoms Blood transfusions: none Sexually transmitted diseases: no past history  Last mammogram: none  Last pap: normal  Previous C/S   Menstrual History:  No LMP recorded.    Past Medical History  Diagnosis Date  . Pulmonary embolism   . Asthma     rarely use - rescue inhaler  . Anxiety     no meds currently  . Depression     no meds currently  . Deep vein blood clot of left lower extremity 02/2011    dvt and pulmonary embolism  . Headache   . History of kidney stones     Past Surgical History  Procedure Laterality Date  . Wisdom tooth extraction    . External fixature right arm    . Right wrist surgery    . Right knee surgery    . Cesarean section      x 1  . Lap band surgery      History reviewed. No pertinent family history.  Social History:  reports that she quit smoking about 20 years ago. Her smoking use included Cigarettes. She has a .1 pack-year smoking history. She has never used smokeless tobacco. She reports that  drinks alcohol. She reports that she does not use illicit drugs.  Allergies:  Allergies  Allergen Reactions  . Azithromycin Nausea And Vomiting  . Erythromycin Nausea And Vomiting  . Penicillins Rash  . Sulfamethoxazole W-Trimethoprim Rash    Prescriptions prior to admission  Medication Sig Dispense Refill  . aspirin 325 MG tablet Take 325 mg by mouth daily.      . Calcium Carb-Cholecalciferol 500-400 MG-UNIT CHEW Chew 2 each by mouth daily.       . Multiple Vitamins-Minerals (ADULT GUMMY) CHEW Chew 2 tablets by mouth daily.         Blood pressure 103/68, pulse 72, temperature 98.2 F (36.8 C), temperature source Oral, resp. rate 18, SpO2 98.00%.  Pelvic US Normal endometrial  line.  Results for orders placed during the hospital encounter of 05/04/13 (from the past 24 hour(s))  APTT     Status: None   Collection Time    05/04/13  7:45 AM      Result Value Range   aPTT 31  24 - 37 seconds  CBC     Status: None   Collection Time    05/04/13  7:45 AM      Result Value Range   WBC 7.2  4.0 - 10.5 K/uL   RBC 4.05  3.87 - 5.11 MIL/uL   Hemoglobin 12.9  12.0 - 15.0 g/dL   HCT 91.4  78.2 - 95.6 %   MCV 93.1  78.0 - 100.0 fL   MCH 31.9  26.0 - 34.0 pg   MCHC 34.2  30.0 - 36.0 g/dL   RDW 21.3  08.6 - 57.8 %   Platelets 209  150 - 400 K/uL  COMPREHENSIVE METABOLIC PANEL     Status: Abnormal   Collection Time    05/04/13  7:45 AM      Result Value Range   Sodium 138  135 - 145 mEq/L   Potassium 3.8  3.5 - 5.1 mEq/L   Chloride 105  96 - 112 mEq/L   CO2 26  19 - 32  mEq/L   Glucose, Bld 92  70 - 99 mg/dL   BUN 8  6 - 23 mg/dL   Creatinine, Ser 6.96  0.50 - 1.10 mg/dL   Calcium 8.5  8.4 - 29.5 mg/dL   Total Protein 6.3  6.0 - 8.3 g/dL   Albumin 3.1 (*) 3.5 - 5.2 g/dL   AST 15  0 - 37 U/L   ALT 13  0 - 35 U/L   Alkaline Phosphatase 54  39 - 117 U/L   Total Bilirubin 0.3  0.3 - 1.2 mg/dL   GFR calc non Af Amer >90  >90 mL/min   GFR calc Af Amer >90  >90 mL/min  PROTIME-INR     Status: None   Collection Time    05/04/13  7:45 AM      Result Value Range   Prothrombin Time 12.8  11.6 - 15.2 seconds   INR 0.97  0.00 - 1.49  PREGNANCY, URINE     Status: None   Collection Time    05/04/13  7:50 AM      Result Value Range   Preg Test, Ur NEGATIVE  NEGATIVE    No results found.  Assessment/Plan: Menorrhagia and Desire for Sterilization.  D+C, Novasure endometrial ablation, LPS BT/S cautherization.  Surgery and risks reviewed.  Stacey Myers,MARIE-LYNE 05/04/2013, 9:14 AM

## 2013-05-04 NOTE — Anesthesia Preprocedure Evaluation (Addendum)
Anesthesia Evaluation  Patient identified by MRN, date of birth, ID band Patient awake    Reviewed: Allergy & Precautions, H&P , Patient's Chart, lab work & pertinent test results, reviewed documented beta blocker date and time   History of Anesthesia Complications Negative for: history of anesthetic complications  Airway Mallampati: III TM Distance: >3 FB Neck ROM: full    Dental no notable dental hx.    Pulmonary neg pulmonary ROS, asthma ,  breath sounds clear to auscultation  Pulmonary exam normal       Cardiovascular Exercise Tolerance: Good + Peripheral Vascular Disease negative cardio ROS  Rhythm:regular Rate:Normal     Neuro/Psych  Headaches, PSYCHIATRIC DISORDERS Anxiety Depression negative neurological ROS  negative psych ROS   GI/Hepatic negative GI ROS, Neg liver ROS,   Endo/Other  negative endocrine ROSMorbid obesity  Renal/GU negative Renal ROS     Musculoskeletal   Abdominal   Peds  Hematology negative hematology ROS (+)   Anesthesia Other Findings Pulmonary embolism     Asthma   rarely use - rescue inhaler    Anxiety   no meds currently Depression   no meds currently    Deep vein blood clot of left lower extremity 02/2011 dvt and pulmonary embolism Headache        History of kidney stones      Reproductive/Obstetrics negative OB ROS                           Anesthesia Physical Anesthesia Plan  ASA: III  Anesthesia Plan: General   Post-op Pain Management:    Induction: Intravenous  Airway Management Planned: Oral ETT  Additional Equipment:   Intra-op Plan:   Post-operative Plan: Extubation in OR  Informed Consent: I have reviewed the patients History and Physical, chart, labs and discussed the procedure including the risks, benefits and alternatives for the proposed anesthesia with the patient or authorized representative who has indicated his/her understanding  and acceptance.   Dental Advisory Given  Plan Discussed with: CRNA, Surgeon and Anesthesiologist  Anesthesia Plan Comments:        Anesthesia Quick Evaluation

## 2013-05-04 NOTE — Anesthesia Postprocedure Evaluation (Signed)
Anesthesia Post Note  Patient: Stacey Myers  Procedure(s) Performed: Procedure(s) (LRB): DILATATION & CURETTAGE/HYSTEROSCOPY WITH NOVASURE ABLATION (N/A) LAPAROSCOPIC TUBAL LIGATION (Bilateral)  Anesthesia type: General  Patient location: PACU  Post pain: Pain level controlled  Post assessment: Post-op Vital signs reviewed  Last Vitals:  Filed Vitals:   05/04/13 1130  BP: 106/62  Pulse: 73  Temp:   Resp: 14    Post vital signs: Reviewed  Level of consciousness: sedated  Complications: No apparent anesthesia complications

## 2013-05-04 NOTE — Anesthesia Procedure Notes (Signed)
Procedure Name: Intubation Date/Time: 05/04/2013 9:53 AM Performed by: Graciela Husbands Pre-anesthesia Checklist: Suction available, Timeout performed, Emergency Drugs available, Patient identified and Patient being monitored Patient Re-evaluated:Patient Re-evaluated prior to inductionOxygen Delivery Method: Circle system utilized Preoxygenation: Pre-oxygenation with 100% oxygen Intubation Type: IV induction Ventilation: Mask ventilation without difficulty Laryngoscope Size: Mac and 3 Grade View: Grade I Tube type: Oral Tube size: 7.0 mm Number of attempts: 1 Airway Equipment and Method: Patient positioned with wedge pillow and Stylet Placement Confirmation: ETT inserted through vocal cords under direct vision,  positive ETCO2 and breath sounds checked- equal and bilateral Secured at: 21 cm Tube secured with: Tape Dental Injury: Teeth and Oropharynx as per pre-operative assessment

## 2013-05-04 NOTE — Op Note (Signed)
05/04/2013  11:11 AM  PATIENT:  Stacey Myers  37 y.o. female  PRE-OPERATIVE DIAGNOSIS:  Desires Sterilization, Menorrhagia    POST-OPERATIVE DIAGNOSIS:  desired sterilization, menorrhagia  PROCEDURE:  Procedure(s): DILATATION & CURETTAGE/NOVASURE ABLATION LAPAROSCOPIC TUBAL LIGATION BY CAUTHERIZATION  SURGEON:  Surgeon(s): Genia Del, MD  ASSISTANTS: none   ANESTHESIA:   general  PROCEDURE:  Under general anesthesia with laryngeal mask the patient is in lithotomy position. She is prepped with ChloraPrep on the abdomen and with Betadine on the suprapubic, vulvar and vaginal areas.  She is draped as usual. The vaginal exam reveals an anteverted uterus normal volume no adnexal mass. The speculum is inserted in the vagina. Anterior lip of the cervix is grasped with a tenaculum.a paracervical block is done with Nesacaine 1% a total of 20 cc at 4 and 8:00. The cervical way it is at 4 cm. The hysterometry is at 9 cm. For a cavity length of 5 cm.  The cervix was dilated with Hegar's dilators up to #23 without difficulty.  An endometrial curettage was done with a sharp curet on all intrauterine surfaces.  The specimen is sent to pathology. We then insert the NovaSure instrument and the intrauterine cavity. We measured the width of the uterus which is at 4.2 cm. The security catheter is passed.  The ablation is done at the power of 113 for 1 minute and 16 seconds. The instruments is then removed from the cavity. The 1 tooth cannula is inserted in the uterus. The tenaculum was removed. The speculum is removed.  We then go to the abdomen. We infiltrate the infraumbilical area with Marcaine one quarter plain. We make a 1.5 cm incision at that level with the scalpel. The aponeurosis is grasped with cokers and section with Mayo scissors. The parietal peritoneum is opened bluntly with the finger. We make a pursestring stitch of Vicryl 0 at the aponeurosis. The Roseanne Reno is inserted in the cavity and a  pneumoperitoneum is created with CO2. The camera is inserted at that level.  The liver and gallbladder are normal to inspection. The appendix appears retrocecal. The uterus is normal in size and appearance. Both tubes are normal in size and appearance including fimbria bilaterally. Both ovaries are normal in size and appearance. No pathology is seen in the pelvis.  We used the Kleppinger to the cauterized the right tube at about 2 cm from the cornua including the mesosalpinx.  We cauterized the right tube distal to that area and then in between.  We proceeded the same way on the left side.  Pictures were taken before and after the cauterization of the tubes.  Hemostasis was adequate at all levels. The instruments were removed. The trocar was removed. The CO2 was evacuated. We closed the pursestring stitch at the aponeurosis. We then completed hemostasis on the adipose tissue with the electrocautery. We closed the skin with a subcuticular stitch of Vicryl 4-0. We added Dermabond. The instrument was removed from the vagina.  The patient was brought to recovery room in good and stable status.  ESTIMATED BLOOD LOSS: 10 cc   Intake/Output Summary (Last 24 hours) at 05/04/13 1111 Last data filed at 05/04/13 1104  Gross per 24 hour  Intake   2000 ml  Output     10 ml  Net   1990 ml     BLOOD ADMINISTERED:none   LOCAL MEDICATIONS USED:  MARCAINE     SPECIMEN:  Source of Specimen:  Endometrial curettings  DISPOSITION OF SPECIMEN:  PATHOLOGY  COUNTS:  YES  PLAN OF CARE: Transfer to PACU  Genia Del MD  05/04/2013 at 11:10 am

## 2013-05-07 ENCOUNTER — Encounter (HOSPITAL_COMMUNITY): Payer: Self-pay | Admitting: Obstetrics & Gynecology

## 2013-09-13 ENCOUNTER — Encounter (HOSPITAL_COMMUNITY): Payer: Self-pay | Admitting: Emergency Medicine

## 2013-09-13 ENCOUNTER — Emergency Department (HOSPITAL_COMMUNITY)
Admission: EM | Admit: 2013-09-13 | Discharge: 2013-09-13 | Disposition: A | Discharge status: Home / Self Care | Payer: BC Managed Care – PPO | Attending: Emergency Medicine | Admitting: Emergency Medicine

## 2013-09-13 ENCOUNTER — Emergency Department (HOSPITAL_COMMUNITY): Payer: BC Managed Care – PPO

## 2013-09-13 DIAGNOSIS — J45901 Unspecified asthma with (acute) exacerbation: Secondary | ICD-10-CM | POA: Insufficient documentation

## 2013-09-13 DIAGNOSIS — R0789 Other chest pain: Secondary | ICD-10-CM | POA: Insufficient documentation

## 2013-09-13 DIAGNOSIS — R51 Headache: Secondary | ICD-10-CM | POA: Insufficient documentation

## 2013-09-13 DIAGNOSIS — Z79899 Other long term (current) drug therapy: Secondary | ICD-10-CM | POA: Insufficient documentation

## 2013-09-13 DIAGNOSIS — Z86711 Personal history of pulmonary embolism: Secondary | ICD-10-CM | POA: Insufficient documentation

## 2013-09-13 DIAGNOSIS — R000 Tachycardia, unspecified: Secondary | ICD-10-CM | POA: Insufficient documentation

## 2013-09-13 DIAGNOSIS — Z87442 Personal history of urinary calculi: Secondary | ICD-10-CM | POA: Insufficient documentation

## 2013-09-13 DIAGNOSIS — Z8659 Personal history of other mental and behavioral disorders: Secondary | ICD-10-CM | POA: Insufficient documentation

## 2013-09-13 DIAGNOSIS — R509 Fever, unspecified: Secondary | ICD-10-CM | POA: Insufficient documentation

## 2013-09-13 DIAGNOSIS — Z7982 Long term (current) use of aspirin: Secondary | ICD-10-CM | POA: Insufficient documentation

## 2013-09-13 DIAGNOSIS — Z87891 Personal history of nicotine dependence: Secondary | ICD-10-CM | POA: Insufficient documentation

## 2013-09-13 DIAGNOSIS — Z8679 Personal history of other diseases of the circulatory system: Secondary | ICD-10-CM | POA: Insufficient documentation

## 2013-09-13 DIAGNOSIS — Z880 Allergy status to penicillin: Secondary | ICD-10-CM | POA: Insufficient documentation

## 2013-09-13 DIAGNOSIS — N390 Urinary tract infection, site not specified: Secondary | ICD-10-CM | POA: Insufficient documentation

## 2013-09-13 DIAGNOSIS — R002 Palpitations: Secondary | ICD-10-CM | POA: Insufficient documentation

## 2013-09-13 LAB — URINE MICROSCOPIC-ADD ON

## 2013-09-13 LAB — POCT PREGNANCY, URINE: Preg Test, Ur: NEGATIVE

## 2013-09-13 LAB — CBC
HCT: 39.4 % (ref 36.0–46.0)
MCV: 91.8 fL (ref 78.0–100.0)
RBC: 4.29 MIL/uL (ref 3.87–5.11)
WBC: 12.6 10*3/uL — ABNORMAL HIGH (ref 4.0–10.5)

## 2013-09-13 LAB — COMPREHENSIVE METABOLIC PANEL
AST: 23 U/L (ref 0–37)
BUN: 10 mg/dL (ref 6–23)
CO2: 23 mEq/L (ref 19–32)
Chloride: 105 mEq/L (ref 96–112)
Creatinine, Ser: 0.72 mg/dL (ref 0.50–1.10)
GFR calc non Af Amer: >90 mL/min (ref >90)
Total Bilirubin: 0.7 mg/dL (ref 0.3–1.2)

## 2013-09-13 LAB — URINALYSIS, ROUTINE W REFLEX MICROSCOPIC
Bilirubin Urine: NEGATIVE
Hgb urine dipstick: NEGATIVE
Ketones, ur: NEGATIVE mg/dL
Nitrite: NEGATIVE
pH: 6.5 (ref 5.0–8.0)

## 2013-09-13 LAB — D-DIMER, QUANTITATIVE: D-Dimer, Quant: 0.33 ug/mL-FEU (ref 0.00–0.48)

## 2013-09-13 LAB — TROPONIN I: Troponin I: <0.3 ng/mL (ref <0.30)

## 2013-09-13 MED ORDER — SODIUM CHLORIDE 0.9 % IV BOLUS (SEPSIS)
1000.0000 mL | Freq: Once | INTRAVENOUS | Status: AC
  Administered 2013-09-13: 1000 mL via INTRAVENOUS

## 2013-09-13 MED ORDER — CEPHALEXIN 500 MG PO CAPS: 500.0000 mg | ORAL_CAPSULE | Freq: Three times a day (TID) | ORAL | Status: DC

## 2013-09-13 MED ORDER — CEFTRIAXONE SODIUM 1 G IJ SOLR
1.0000 g | Freq: Once | INTRAMUSCULAR | Status: AC
  Administered 2013-09-13: 1 g via INTRAVENOUS
  Filled 2013-09-13: qty 10

## 2013-09-13 MED ORDER — MORPHINE SULFATE 4 MG/ML IJ SOLN
4.0000 mg | Freq: Once | INTRAMUSCULAR | Status: AC
  Administered 2013-09-13: 4 mg via INTRAVENOUS
  Filled 2013-09-13: qty 1

## 2013-09-13 MED ORDER — IOHEXOL 350 MG/ML SOLN
100.0000 mL | Freq: Once | INTRAVENOUS | Status: AC | PRN
  Administered 2013-09-13: 100 mL via INTRAVENOUS

## 2013-09-13 MED ORDER — ONDANSETRON HCL 4 MG/2ML IJ SOLN
4.0000 mg | Freq: Once | INTRAMUSCULAR | Status: AC
  Administered 2013-09-13: 4 mg via INTRAVENOUS
  Filled 2013-09-13: qty 2

## 2013-09-13 NOTE — ED Provider Notes (Signed)
CSN: [id number]     Arrival date & time 09/13/13  4098 History   First MD Initiated Contact with Patient 09/13/13 [phone]     Chief Complaint  Patient presents with  . Shortness of Breath  . Headache   (Consider location/radiation/quality/duration/timing/severity/associated sxs/prior Treatment) The history is provided by the patient, a parent and medical records. No language interpreter was used.    Stacey Myers is a 37 y.o. female  with a hx of PE (no longer on anticoagulation therapy), asthma, anxiety, DVT, kidney stone presents to the Emergency Department complaining of gradual, persistent, progressively worseningchest pain and SOB beginning this evening with associated SOB and tachycardia.  Pt has an albuterol inhaler that she did not attempt to take. Pt reports general illness yesterday with associated chills, fever to 101.4, tachycardia, chest pain, SOB.  Pt takes ASA.  Pt had a tubal ligation in May 2014, but no other recent surgery, no long trips, no exogenous estrogen.  Pt also denies swelling in either leg.  Pt has been around her son who currently has a URI.  Tylenol makes it better and nothing makes it worse.  Pt denies neck pain, neck stiffness, abd pain, N/V/D, weakness, dizziness, syncope, dysuria, hematuria.      Past Medical History  Diagnosis Date  . Pulmonary embolism   . Asthma     rarely use - rescue inhaler  . Anxiety     no meds currently  . Depression     no meds currently  . Deep vein blood clot of left lower extremity 02/2011    dvt and pulmonary embolism  . Headache(784.0)   . History of kidney stones    Past Surgical History  Procedure Laterality Date  . Wisdom tooth extraction    . External fixature right arm    . Right wrist surgery    . Right knee surgery    . Cesarean section      x 1  . Lap band surgery    . Dilitation & currettage/hystroscopy with novasure ablation N/A 05/04/2013    Procedure: DILATATION & CURETTAGE/HYSTEROSCOPY WITH NOVASURE  ABLATION;  Surgeon: Genia Del, MD;  Location: [facility];  Service: Gynecology;  Laterality: N/A;  . Laparoscopic tubal ligation Bilateral 05/04/2013    Procedure: LAPAROSCOPIC TUBAL LIGATION;  Surgeon: Genia Del, MD;  Location: [facility];  Service: Gynecology;  Laterality: Bilateral;   No family history on file. History  Substance Use Topics  . Smoking status: Former Smoker -- 0.10 packs/day for 1 years    Types: Cigarettes    Quit date: 12/27/1992  . Smokeless tobacco: Never Used  . Alcohol Use: Yes     Comment: socially   OB History   Grav Para Term Preterm Abortions TAB SAB Ect Mult Living                 Review of Systems  Constitutional: Positive for fever, chills and fatigue. Negative for diaphoresis, appetite change and unexpected weight change.  HENT: Negative for mouth sores, neck pain and neck stiffness.   Eyes: Negative for visual disturbance.  Respiratory: Positive for cough, chest tightness and shortness of breath. Negative for wheezing.   Cardiovascular: Positive for chest pain and palpitations. Negative for leg swelling.  Gastrointestinal: Negative for nausea, vomiting, abdominal pain, diarrhea and constipation.  Endocrine: Negative for polydipsia, polyphagia and polyuria.  Genitourinary: Negative for dysuria, urgency, frequency and hematuria.  Musculoskeletal: Negative for back pain.  Skin: Negative for rash.  Allergic/Immunologic: Negative for  immunocompromised state.  Neurological: Positive for headaches. Negative for syncope and light-headedness.  Hematological: Does not bruise/bleed easily.  Psychiatric/Behavioral: Negative for sleep disturbance. The patient is not nervous/anxious.     Allergies  Azithromycin; Erythromycin; Penicillins; and Sulfamethoxazole w-trimethoprim  Home Medications   Current Outpatient Rx  Name  Route  Sig  Dispense  Refill  . aspirin 325 MG tablet   Oral   Take 325 mg by mouth daily.         . Calcium  Carb-Cholecalciferol 500-400 MG-UNIT CHEW   Oral   Chew 2 each by mouth daily.          . Multiple Vitamins-Minerals (ADULT GUMMY) CHEW   Oral   Chew 2 tablets by mouth daily.          BP 99/63  Pulse 110  Temp(Src) 98.5 F (36.9 C) (Oral)  Resp 14  SpO2 97% Physical Exam  Nursing note and vitals reviewed. Constitutional: She is oriented to person, place, and time. She appears well-developed and well-nourished. No distress.  Awake, alert, nontoxic appearance  HENT:  Head: Normocephalic and atraumatic.  Right Ear: Tympanic membrane, external ear and ear canal normal.  Left Ear: Tympanic membrane, external ear and ear canal normal.  Nose: Mucosal edema and rhinorrhea present. No epistaxis. Right sinus exhibits no maxillary sinus tenderness and no frontal sinus tenderness. Left sinus exhibits no maxillary sinus tenderness and no frontal sinus tenderness.  Mouth/Throat: Uvula is midline, oropharynx is clear and moist and mucous membranes are normal. Mucous membranes are not pale and not cyanotic. No oropharyngeal exudate, posterior oropharyngeal edema, posterior oropharyngeal erythema or tonsillar abscesses.  Eyes: Conjunctivae and EOM are normal. Pupils are equal, round, and reactive to light. No scleral icterus.  Neck: Normal range of motion and full passive range of motion without pain. Neck supple.  Cardiovascular: Normal rate, regular rhythm, S1 normal, S2 normal, normal heart sounds and intact distal pulses.   No murmur heard. Pulses:      Radial pulses are 2+ on the right side, and 2+ on the left side.       Dorsalis pedis pulses are 2+ on the right side, and 2+ on the left side.       Posterior tibial pulses are 2+ on the right side, and 2+ on the left side.  Capillary refill< 3 sec  Pulmonary/Chest: Effort normal and breath sounds normal. No stridor. No respiratory distress. She has no wheezes.  Coarse breath sounds throughout  Abdominal: Soft. Bowel sounds are normal.  She exhibits no mass. There is no tenderness. There is no rebound and no guarding.  Musculoskeletal: Normal range of motion. She exhibits no edema and no tenderness.  No pitting edema of the legs  Lymphadenopathy:    She has no cervical adenopathy.  Neurological: She is alert and oriented to person, place, and time. She exhibits normal muscle tone. Coordination normal.  Speech is clear and goal oriented Moves extremities without ataxia  Skin: Skin is warm and dry. No rash noted. She is not diaphoretic. No erythema.  Psychiatric: She has a normal mood and affect. Her behavior is normal.    ED Course  Procedures (including critical care time) Labs Review Labs Reviewed  CBC - Abnormal; Notable for the following:    WBC 12.6 (*)    All other components within normal limits  D-DIMER, QUANTITATIVE  COMPREHENSIVE METABOLIC PANEL  TROPONIN I   Imaging Review Dg Chest 2 View  09/13/2013   CLINICAL DATA:  Fever.  EXAM: CHEST  2 VIEW  COMPARISON:  None.  FINDINGS: The heart size and mediastinal contours are within normal limits. Both lungs are clear of consolidation. Nodular density, more dense than neighboring bone, in the peripheral right hemi thorax may represent a bone island or calcified pulmonary nodule.  IMPRESSION: No active cardiopulmonary disease.   Electronically Signed   By: Tiburcio Pea   On: 09/13/2013 05:43    MDM   1. Fever   2. Tachycardia     Maleiya Waiters presents with fever, SOB, tachycardia and CP.  Likely viral URI as pt has had sick contacts for the same, but will assess for PE and ACS.    6:03 AM CXR without evidence of PNA, but CBC with leukocytosis.  Pt with normal d-dimer; no concern for PE.  ECG, troponin and CMP pending.    Discussed with :Sharilyn Sites, PA-C and Dr. Lavella Lemons who will follow and dispo accordingly.    Dahlia Client Murvin Gift, PA-C 09/13/13 [phone]

## 2013-09-13 NOTE — ED Provider Notes (Signed)
Patient with c/o palpitations, racing heart beat with associated SOB and feeling of near syncope. History of unprovoked, multiple subsegmental and left sided segmental PE, per patient. See my note.   Medical screening examination/treatment/procedure(s) were conducted as a shared visit with non-physician practitioner(s) and myself.  I personally evaluated the patient during the encounter   Brandt Loosen, MD 09/13/13 2257

## 2013-09-13 NOTE — ED Notes (Addendum)
Pt. reports SOB with occasional dry cough and headache onset this morning . Low grade fever at triage.

## 2013-09-13 NOTE — ED Provider Notes (Signed)
This patient was received in signout from Dr. Lavella Lemons with CTA pending.  CTA negative. Patient has had fevers and tachycardia while in the ER. She is noted to be orthostatic. Patient was given an additional liter of fluid. Patient was given Rocephin for a UTI.  I reevaluated the patient multiple times. Patient is well-appearing. Her tachycardia is likely secondary to her acute fever. Constellation of symptoms is likely secondary to viral syndrome. She is otherwise healthy and young. Patient would like to be discharged home. Patient was given strict return cautions and must followup with her primary care physician tomorrow for recheck.  After history, exam, and medical workup I feel the patient has been appropriately medically screened and is safe for discharge home. Pertinent diagnoses were discussed with the patient. Patient was given return precautions.  Results for orders placed during the hospital encounter of 09/13/13  CBC      Result Value Range   WBC 12.6 (*) 4.0 - 10.5 K/uL   RBC 4.29  3.87 - 5.11 MIL/uL   Hemoglobin 14.0  12.0 - 15.0 g/dL   HCT 93.8  18.2 - 99.3 %   MCV 91.8  78.0 - 100.0 fL   MCH 32.6  26.0 - 34.0 pg   MCHC 35.5  30.0 - 36.0 g/dL   RDW 71.6  96.7 - 89.3 %   Platelets 196  150 - 400 K/uL  COMPREHENSIVE METABOLIC PANEL      Result Value Range   Sodium 138  135 - 145 mEq/L   Potassium 3.8  3.5 - 5.1 mEq/L   Chloride 105  96 - 112 mEq/L   CO2 23  19 - 32 mEq/L   Glucose, Bld 122 (*) 70 - 99 mg/dL   BUN 10  6 - 23 mg/dL   Creatinine, Ser 8.10  0.50 - 1.10 mg/dL   Calcium 8.4  8.4 - 17.5 mg/dL   Total Protein 7.2  6.0 - 8.3 g/dL   Albumin 3.5  3.5 - 5.2 g/dL   AST 23  0 - 37 U/L   ALT 13  0 - 35 U/L   Alkaline Phosphatase 63  39 - 117 U/L   Total Bilirubin 0.7  0.3 - 1.2 mg/dL   GFR calc non Af Amer >90  >90 mL/min   GFR calc Af Amer >90  >90 mL/min  TROPONIN I      Result Value Range   Troponin I <0.30  <0.30 ng/mL  D-DIMER, QUANTITATIVE      Result Value  Range   D-Dimer, Quant 0.33  0.00 - 0.48 ug/mL-FEU  URINALYSIS, ROUTINE W REFLEX MICROSCOPIC      Result Value Range   Color, Urine YELLOW  YELLOW   APPearance CLOUDY (*) CLEAR   Specific Gravity, Urine 1.011  1.005 - 1.030   pH 6.5  5.0 - 8.0   Glucose, UA NEGATIVE  NEGATIVE mg/dL   Hgb urine dipstick NEGATIVE  NEGATIVE   Bilirubin Urine NEGATIVE  NEGATIVE   Ketones, ur NEGATIVE  NEGATIVE mg/dL   Protein, ur NEGATIVE  NEGATIVE mg/dL   Urobilinogen, UA 0.2  0.0 - 1.0 mg/dL   Nitrite NEGATIVE  NEGATIVE   Leukocytes, UA LARGE (*) NEGATIVE  URINE MICROSCOPIC-ADD ON      Result Value Range   Squamous Epithelial / LPF RARE  RARE   WBC, UA 21-50  <3 WBC/hpf   Bacteria, UA FEW (*) RARE  POCT PREGNANCY, URINE      Result Value Range  Preg Test, Ur NEGATIVE  NEGATIVE   Dg Chest 2 View  09/13/2013   CLINICAL DATA:  Fever.  EXAM: CHEST  2 VIEW  COMPARISON:  None.  FINDINGS: The heart size and mediastinal contours are within normal limits. Both lungs are clear of consolidation. Nodular density, more dense than neighboring bone, in the peripheral right hemi thorax may represent a bone island or calcified pulmonary nodule.  IMPRESSION: No active cardiopulmonary disease.   Electronically Signed   By: Tiburcio Pea   On: 09/13/2013 05:43   Ct Angio Chest Pe W/cm &/or Wo Cm  09/13/2013   CLINICAL DATA:  Tachycardia, low grade fever, shortness of Breath.  EXAM: CT ANGIOGRAPHY CHEST WITH CONTRAST  TECHNIQUE: Multidetector CT imaging of the chest was performed using the standard protocol during bolus administration of intravenous contrast. Multiplanar CT image reconstructions including MIPs were obtained to evaluate the vascular anatomy.  CONTRAST:  OMNIPAQUE IOHEXOL 350 MG/ML SOLN  COMPARISON:  09/13/2013  FINDINGS: No filling defects in the pulmonary arteries to suggest pulmonary emboli. Insert Heart No mediastinal, hilar, or axillary adenopathy. Visualized thyroid and chest wall soft tissues  unremarkable.  Minimal dependent ground-glass opacities in the lungs, likely dependent atelectasis. Lungs otherwise clear. No pleural effusions.  No acute bony abnormality.  Review of the MIP images confirms the above findings.  IMPRESSION: No acute findings.   Electronically Signed   By: Charlett Nose M.D.   On: 09/13/2013 09:50     Shon Baton, MD 09/13/13 1340

## 2013-09-13 NOTE — ED Notes (Signed)
Pt states that she is unable to stand because she was given morphine. PA notified.

## 2013-09-13 NOTE — ED Provider Notes (Addendum)
37 yo woman with history of PE presents with complaints of general malaise x approx 24 hrs. "Something was just off" yesterday.  She awoke around 1am this morning with racing heart beat, SOB and a feeling that she might pass out. Patient denies cough and URI sx.   Patient is s/p unprovoked  PE in March 2012. Patient says she had a normal hypercoag work up with a hematologist and was told to discontinue her oral contraceptive which, at the time, she says, contained high doses of estrogen. The patient is s/p BTL and no longer uses any hormonal pregnancy prophylaxis. She has no history of smoking.   Patient notes associated headache. She denies abdominal pain, N/V/D and GU sx. NO unusual vaginal discharge. Her work up is thus far notable for fever and tachycardia on arrival, WBC of 13.4K and no acute findings on CXR. U/A is wnl. D-dimer was ordered but, is insufficient to rule out PE in this high pretest probability patient.   No alternative diagnoses at this point although viral syndrome is quite probable. Nonetheless, we will obtain CTA chest to rule out PE.   Brandt Loosen, MD 09/13/13 [phone]  [id number]:  CTA chest negative for PE. Patient has UTI with quasi septic picture. She has responded well to fluid resuscitation. We will tx empirically with Ceftriaxone in the ED. Urine to culture. Patient will need close outpatient.   Brandt Loosen, MD 09/13/13 [phone]

## 2013-09-13 NOTE — ED Notes (Signed)
Patient transported to X-ray 

## 2013-09-13 NOTE — ED Provider Notes (Signed)
Patient evaluated fully by me. Please see previously documented note. I supervised PA Sanders when she assumed care of the patient and change of shift and shared in the evaluation, work up and disposition of this patient.   Medical screening examination/treatment/procedure(s) were conducted as a shared visit with non-physician practitioner(s) and myself.  I personally evaluated the patient during the encounter   Brandt Loosen, MD 09/13/13 2258

## 2013-09-13 NOTE — ED Provider Notes (Signed)
Pt received in sign out from PA Muthersbaugh at shift change.  Pt with PMH significant for PE, asthma, anxiety, DVT, renal stones presents to the ED for worsening chest pain and SOB.  Pt takes ASA, no other anticoagulation therapy at this time. Son at home has URI.  On arrival, pt febrile and tachycardic.  Labs significant for leukocytosis at 12.6.  D-dimer negative.    Plan:  Pt to be evaluated by Dr. Lavella Lemons with EKG and trop pending. IVF given.  Likely d/c once VS stabilized.   Date: 09/13/2013  Rate: 93  Rhythm: normal sinus rhythm  QRS Axis: normal  Intervals: normal  ST/T Wave abnormalities: nonspecific T wave changes  Conduction Disutrbances:none  Narrative Interpretation:   Old EKG Reviewed: unchanged  7:47 AM EKG NSR without acute ischemic changes.  Trop negative.  IVF given with good improvement of tachycardia.  Pt evaluated by Dr. Lavella Lemons-- advised we will obtain CT angio, u/a, and blood cultures.  Results for orders placed during the hospital encounter of 09/13/13  CBC      Result Value Range   WBC 12.6 (*) 4.0 - 10.5 K/uL   RBC 4.29  3.87 - 5.11 MIL/uL   Hemoglobin 14.0  12.0 - 15.0 g/dL   HCT 40.9  81.1 - 91.4 %   MCV 91.8  78.0 - 100.0 fL   MCH 32.6  26.0 - 34.0 pg   MCHC 35.5  30.0 - 36.0 g/dL   RDW 78.2  95.6 - 21.3 %   Platelets 196  150 - 400 K/uL  COMPREHENSIVE METABOLIC PANEL      Result Value Range   Sodium 138  135 - 145 mEq/L   Potassium 3.8  3.5 - 5.1 mEq/L   Chloride 105  96 - 112 mEq/L   CO2 23  19 - 32 mEq/L   Glucose, Bld 122 (*) 70 - 99 mg/dL   BUN 10  6 - 23 mg/dL   Creatinine, Ser 0.86  0.50 - 1.10 mg/dL   Calcium 8.4  8.4 - 57.8 mg/dL   Total Protein 7.2  6.0 - 8.3 g/dL   Albumin 3.5  3.5 - 5.2 g/dL   AST 23  0 - 37 U/L   ALT 13  0 - 35 U/L   Alkaline Phosphatase 63  39 - 117 U/L   Total Bilirubin 0.7  0.3 - 1.2 mg/dL   GFR calc non Af Amer >90  >90 mL/min   GFR calc Af Amer >90  >90 mL/min  TROPONIN I      Result Value Range   Troponin I  <0.30  <0.30 ng/mL  D-DIMER, QUANTITATIVE      Result Value Range   D-Dimer, Quant 0.33  0.00 - 0.48 ug/mL-FEU  URINALYSIS, ROUTINE W REFLEX MICROSCOPIC      Result Value Range   Color, Urine YELLOW  YELLOW   APPearance CLOUDY (*) CLEAR   Specific Gravity, Urine 1.011  1.005 - 1.030   pH 6.5  5.0 - 8.0   Glucose, UA NEGATIVE  NEGATIVE mg/dL   Hgb urine dipstick NEGATIVE  NEGATIVE   Bilirubin Urine NEGATIVE  NEGATIVE   Ketones, ur NEGATIVE  NEGATIVE mg/dL   Protein, ur NEGATIVE  NEGATIVE mg/dL   Urobilinogen, UA 0.2  0.0 - 1.0 mg/dL   Nitrite NEGATIVE  NEGATIVE   Leukocytes, UA LARGE (*) NEGATIVE  URINE MICROSCOPIC-ADD ON      Result Value Range   Squamous Epithelial / LPF RARE  RARE   WBC, UA 21-50  <3 WBC/hpf   Bacteria, UA FEW (*) RARE  POCT PREGNANCY, URINE      Result Value Range   Preg Test, Ur NEGATIVE  NEGATIVE   Dg Chest 2 View  09/13/2013   CLINICAL DATA:  Fever.  EXAM: CHEST  2 VIEW  COMPARISON:  None.  FINDINGS: The heart size and mediastinal contours are within normal limits. Both lungs are clear of consolidation. Nodular density, more dense than neighboring bone, in the peripheral right hemi thorax may represent a bone island or calcified pulmonary nodule.  IMPRESSION: No active cardiopulmonary disease.   Electronically Signed   By: Tiburcio Pea   On: 09/13/2013 05:43   Ct Angio Chest Pe W/cm &/or Wo Cm  09/13/2013   CLINICAL DATA:  Tachycardia, low grade fever, shortness of Breath.  EXAM: CT ANGIOGRAPHY CHEST WITH CONTRAST  TECHNIQUE: Multidetector CT imaging of the chest was performed using the standard protocol during bolus administration of intravenous contrast. Multiplanar CT image reconstructions including MIPs were obtained to evaluate the vascular anatomy.  CONTRAST:  OMNIPAQUE IOHEXOL 350 MG/ML SOLN  COMPARISON:  09/13/2013  FINDINGS: No filling defects in the pulmonary arteries to suggest pulmonary emboli. Insert Heart No mediastinal, hilar, or axillary  adenopathy. Visualized thyroid and chest wall soft tissues unremarkable.  Minimal dependent ground-glass opacities in the lungs, likely dependent atelectasis. Lungs otherwise clear. No pleural effusions.  No acute bony abnormality.  Review of the MIP images confirms the above findings.  IMPRESSION: No acute findings.   Electronically Signed   By: Charlett Nose M.D.   On: 09/13/2013 09:50    1000-- CT angio negative, i have advised pt of these results.  Dr. Lavella Lemons has ordered rocephin and orthostatics.  Will re-assess when complete.  12:10 PM Pt had episode of orthostasis when standing to use the restroom.  States she felt somewhat dizzy but only for a few seconds then returned to normal.  Will give additional liter NS and re-assess.  Pt does not want hospital admission unless necessary.  1:22 PM Pt given additional liter of fluids with stabilization of BP but fever has returned at 101.67F.  Evaluated by Dr. Wilkie Aye and cleared for discharge-- sx likely with viral etiology.  She will FU with her PCP.  Rx keflex.  Discussed plan with pt, she agreed.  Return precautions advised.  Garlon Hatchet, PA-C 09/13/13 1439

## 2013-09-13 NOTE — ED Notes (Signed)
Pt ambulated to restroom. BP was taken after: 90/53. PA notified.

## 2013-09-13 NOTE — Discharge Instructions (Signed)
Take the prescribed medication as directed. °Follow-up with your primary care physician. °Return to the ED for new or worsening symptoms. ° °

## 2013-09-14 LAB — URINE CULTURE: Colony Count: 9000

## 2013-09-16 ENCOUNTER — Emergency Department (HOSPITAL_COMMUNITY)
Admission: EM | Admit: 2013-09-16 | Discharge: 2013-09-16 | Disposition: A | Discharge status: Home / Self Care | Payer: BC Managed Care – PPO | Attending: Emergency Medicine | Admitting: Emergency Medicine

## 2013-09-16 ENCOUNTER — Encounter (HOSPITAL_COMMUNITY): Payer: Self-pay | Admitting: Emergency Medicine

## 2013-09-16 ENCOUNTER — Emergency Department (HOSPITAL_COMMUNITY): Payer: BC Managed Care – PPO

## 2013-09-16 DIAGNOSIS — Z7982 Long term (current) use of aspirin: Secondary | ICD-10-CM | POA: Insufficient documentation

## 2013-09-16 DIAGNOSIS — J45901 Unspecified asthma with (acute) exacerbation: Secondary | ICD-10-CM | POA: Insufficient documentation

## 2013-09-16 DIAGNOSIS — Z87442 Personal history of urinary calculi: Secondary | ICD-10-CM | POA: Insufficient documentation

## 2013-09-16 DIAGNOSIS — Z86718 Personal history of other venous thrombosis and embolism: Secondary | ICD-10-CM | POA: Insufficient documentation

## 2013-09-16 DIAGNOSIS — Z79899 Other long term (current) drug therapy: Secondary | ICD-10-CM | POA: Insufficient documentation

## 2013-09-16 DIAGNOSIS — R0609 Other forms of dyspnea: Secondary | ICD-10-CM | POA: Insufficient documentation

## 2013-09-16 DIAGNOSIS — Z87891 Personal history of nicotine dependence: Secondary | ICD-10-CM | POA: Insufficient documentation

## 2013-09-16 DIAGNOSIS — Z792 Long term (current) use of antibiotics: Secondary | ICD-10-CM | POA: Insufficient documentation

## 2013-09-16 DIAGNOSIS — R0989 Other specified symptoms and signs involving the circulatory and respiratory systems: Secondary | ICD-10-CM | POA: Insufficient documentation

## 2013-09-16 DIAGNOSIS — Z8659 Personal history of other mental and behavioral disorders: Secondary | ICD-10-CM | POA: Insufficient documentation

## 2013-09-16 DIAGNOSIS — R0789 Other chest pain: Secondary | ICD-10-CM | POA: Insufficient documentation

## 2013-09-16 DIAGNOSIS — Z880 Allergy status to penicillin: Secondary | ICD-10-CM | POA: Insufficient documentation

## 2013-09-16 DIAGNOSIS — Z86711 Personal history of pulmonary embolism: Secondary | ICD-10-CM | POA: Insufficient documentation

## 2013-09-16 LAB — CBC
HCT: 41.1 % (ref 36.0–46.0)
Hemoglobin: 14.1 g/dL (ref 12.0–15.0)
MCHC: 34.3 g/dL (ref 30.0–36.0)
RBC: 4.39 MIL/uL (ref 3.87–5.11)

## 2013-09-16 LAB — BASIC METABOLIC PANEL
BUN: 10 mg/dL (ref 6–23)
CO2: 26 mEq/L (ref 19–32)
GFR calc non Af Amer: >90 mL/min (ref >90)
Glucose, Bld: 106 mg/dL — ABNORMAL HIGH (ref 70–99)
Potassium: 4.5 mEq/L (ref 3.5–5.1)
Sodium: 139 mEq/L (ref 135–145)

## 2013-09-16 LAB — POCT I-STAT TROPONIN I

## 2013-09-16 LAB — PRO B NATRIURETIC PEPTIDE: Pro B Natriuretic peptide (BNP): 132.3 pg/mL — ABNORMAL HIGH (ref 0–125)

## 2013-09-16 MED ORDER — SODIUM CHLORIDE 0.9 % IV BOLUS (SEPSIS): 1000.0000 mL | Freq: Once | INTRAVENOUS | Status: DC

## 2013-09-16 MED ORDER — SODIUM CHLORIDE 0.9 % IV BOLUS (SEPSIS)
1000.0000 mL | Freq: Once | INTRAVENOUS | Status: AC
  Administered 2013-09-16: 1000 mL via INTRAVENOUS

## 2013-09-16 MED ORDER — ALBUTEROL SULFATE HFA 108 (90 BASE) MCG/ACT IN AERS
2.0000 | INHALATION_SPRAY | Freq: Once | RESPIRATORY_TRACT | Status: AC
  Administered 2013-09-16: 2 via RESPIRATORY_TRACT
  Filled 2013-09-16: qty 6.7

## 2013-09-16 NOTE — Discharge Instructions (Signed)
Chest Pain (Nonspecific) °It is often hard to give a specific diagnosis for the cause of chest pain. There is always a chance that your pain could be related to something serious, such as a heart attack or a blood clot in the lungs. You need to follow up with your caregiver for further evaluation. °CAUSES  °· Heartburn. °· Pneumonia or bronchitis. °· Anxiety or stress. °· Inflammation around your heart (pericarditis) or lung (pleuritis or pleurisy). °· A blood clot in the lung. °· A collapsed lung (pneumothorax). It can develop suddenly on its own (spontaneous pneumothorax) or from injury (trauma) to the chest. °· Shingles infection (herpes zoster virus). °The chest wall is composed of bones, muscles, and cartilage. Any of these can be the source of the pain. °· The bones can be bruised by injury. °· The muscles or cartilage can be strained by coughing or overwork. °· The cartilage can be affected by inflammation and become sore (costochondritis). °DIAGNOSIS  °Lab tests or other studies, such as X-rays, electrocardiography, stress testing, or cardiac imaging, may be needed to find the cause of your pain.  °TREATMENT  °· Treatment depends on what may be causing your chest pain. Treatment may include: °· Acid blockers for heartburn. °· Anti-inflammatory medicine. °· Pain medicine for inflammatory conditions. °· Antibiotics if an infection is present. °· You may be advised to change lifestyle habits. This includes stopping smoking and avoiding alcohol, caffeine, and chocolate. °· You may be advised to keep your head raised (elevated) when sleeping. This reduces the chance of acid going backward from your stomach into your esophagus. °· Most of the time, nonspecific chest pain will improve within 2 to 3 days with rest and mild pain medicine. °HOME CARE INSTRUCTIONS  °· If antibiotics were prescribed, take your antibiotics as directed. Finish them even if you start to feel better. °· For the next few days, avoid physical  activities that bring on chest pain. Continue physical activities as directed. °· Do not smoke. °· Avoid drinking alcohol. °· Only take over-the-counter or prescription medicine for pain, discomfort, or fever as directed by your caregiver. °· Follow your caregiver's suggestions for further testing if your chest pain does not go away. °· Keep any follow-up appointments you made. If you do not go to an appointment, you could develop lasting (chronic) problems with pain. If there is any problem keeping an appointment, you must call to reschedule. °SEEK MEDICAL CARE IF:  °· You think you are having problems from the medicine you are taking. Read your medicine instructions carefully. °· Your chest pain does not go away, even after treatment. °· You develop a rash with blisters on your chest. °SEEK IMMEDIATE MEDICAL CARE IF:  °· You have increased chest pain or pain that spreads to your arm, neck, jaw, back, or abdomen. °· You develop shortness of breath, an increasing cough, or you are coughing up blood. °· You have severe back or abdominal pain, feel nauseous, or vomit. °· You develop severe weakness, fainting, or chills. °· You have a fever. °THIS IS AN EMERGENCY. Do not wait to see if the pain will go away. Get medical help at once. Call your local emergency services (911 in [place] Do not drive yourself to the hospital. °MAKE SURE YOU:  °· Understand these instructions. °· Will watch your condition. °· Will get help right away if you are not doing well or get worse. °Document Released: 09/22/2005 Document Revised: 03/06/2012 Document Reviewed: 07/18/2008 °ExitCare® Patient Information ©2014 [employer]   LL[employer]. Viral Infections A virus is a type of germ. Viruses can cause:  Minor sore throats.  Aches and pains.  Headaches.  Runny nose.  Rashes.  Watery eyes.  Tiredness.  Coughs.  Loss of appetite.  Feeling sick to your stomach (nausea).  Throwing up (vomiting).  Watery poop (diarrhea). HOME CARE     Only take medicines as told by your doctor.  Drink enough water and fluids to keep your pee (urine) clear or pale yellow. Sports drinks are a good choice.  Get plenty of rest and eat healthy. Soups and broths with crackers or rice are fine. GET HELP RIGHT AWAY IF:   You have a very bad headache.  You have shortness of breath.  You have chest pain or neck pain.  You have an unusual rash.  You cannot stop throwing up.  You have watery poop that does not stop.  You cannot keep fluids down.  You or your child has a temperature by mouth above 102 F (38.9 C), not controlled by medicine.  Your baby is older than 3 months with a rectal temperature of 102 F (38.9 C) or higher.  Your baby is 41 months old or younger with a rectal temperature of 100.4 F (38 C) or higher. MAKE SURE YOU:   Understand these instructions.  Will watch this condition.  Will get help right away if you are not doing well or get worse. Document Released: 11/25/2008 Document Revised: 03/06/2012 Document Reviewed: 04/20/2011 [facility] Patient Information 2014 [place], [place]. Dyspnea Shortness of breath (dyspnea) is the feeling of uneasy breathing. Dyspnea should be evaluated promptly. DIAGNOSIS  Many tests may be done to find why you are having shortness of breath. Tests may include:  A chest X-ray.   A lung function test.   Blood tests.   Recordings of the electrical activity of the heart (electrocardiogram).   Exercise testing.   Sound wave images of the heart (a cardiac echocardiogram).   A scan.  A cause for your shortness of breath may not be identified initially. In this case, it is important to have a follow-up exam with your caregiver. HOME CARE INSTRUCTIONS   Do not smoke. Smoking is a common cause of shortness of breath. Ask for help to stop smoking.   Avoid being around chemicals that may bother your breathing, such as paint fumes or dust.   Rest as needed. Slowly begin your  usual activities.   If medications were prescribed, take them as directed for the full length of time directed. This includes oxygen and any inhaled medications, if prescribed.   It is very important that you follow up with your caregiver or other physician as directed. Waiting to do so or failure to follow up could result in worsening of your condition, possible disability, or death.   Be sure you understand what to do or who to call if your shortness of breath worsens.  SEEK MEDICAL CARE IF:   Your condition does not improve in the time expected.   You have a hard time doing your normal activities even with rest.   You have any side effects from or problems with medications prescribed.  SEEK IMMEDIATE MEDICAL CARE IF:   You feel your shortness of breath is getting worse.   You feel lightheaded, faint or develop a cough not controlled with medications.   You start coughing up blood.   You get pain with breathing.   You get chest pain or pain in your arms,  shoulders or belly (abdomen).   You have a fever.   You are unable to walk up stairs or exercise the way you normally can.  MAKE SURE YOU:   Understand these instructions.   Will watch your condition.   Will get help right away if you are not doing well or get worse.  Document Released: 01/20/2005 Document Revised: 08/25/2011 Document Reviewed: 04/30/2010 [facility] Patient Information 2012 [place], [place].

## 2013-09-16 NOTE — ED Notes (Signed)
Pt c/o increased SOB; pt seen here for same on Thursday and discharged; pt sts worse with exertion and hx of PE

## 2013-09-16 NOTE — ED Provider Notes (Signed)
CSN: [id number]     Arrival date & time 09/16/13  1240 History   First MD Initiated Contact with Patient 09/16/13 1353     Chief Complaint  Patient presents with  . Shortness of Breath   (Consider location/radiation/quality/duration/timing/severity/associated sxs/prior Treatment) Patient is a 37 y.o. female presenting with shortness of breath. The history is provided by the patient. No language interpreter was used.  Shortness of Breath Severity:  Moderate Onset quality:  Unable to specify Duration:  5 days Timing:  Intermittent Progression:  Waxing and waning Chronicity:  New Context: activity   Relieved by:  Rest Worsened by:  Exertion and movement Ineffective treatments:  None tried Associated symptoms: chest pain and cough (now resolved)   Associated symptoms: no abdominal pain, no diaphoresis, no fever, no headaches, no neck pain, no sore throat, no sputum production, no syncope and no vomiting   Chest pain:    Chest pain quality: tightness.   Severity:  Moderate   Onset quality:  Unable to specify   Duration:  5 days   Timing:  Intermittent   Progression:  Waxing and waning   Chronicity:  New (with mvmt or exertion) Risk factors: hx of PE/DVT (with negative PE study 09/13/13 for similar symptoms) and obesity   Risk factors: no recent alcohol use, no oral contraceptive use, no prolonged immobilization, no recent surgery and no tobacco use     Past Medical History  Diagnosis Date  . Pulmonary embolism   . Asthma     rarely use - rescue inhaler  . Anxiety     no meds currently  . Depression     no meds currently  . Deep vein blood clot of left lower extremity 02/2011    dvt and pulmonary embolism  . Headache(784.0)   . History of kidney stones    Past Surgical History  Procedure Laterality Date  . Wisdom tooth extraction    . External fixature right arm    . Right wrist surgery    . Right knee surgery    . Cesarean section      x 1  . Lap band surgery    .  Dilitation & currettage/hystroscopy with novasure ablation N/A 05/04/2013    Procedure: DILATATION & CURETTAGE/HYSTEROSCOPY WITH NOVASURE ABLATION;  Surgeon: Genia Del, MD;  Location: [facility];  Service: Gynecology;  Laterality: N/A;  . Laparoscopic tubal ligation Bilateral 05/04/2013    Procedure: LAPAROSCOPIC TUBAL LIGATION;  Surgeon: Genia Del, MD;  Location: [facility];  Service: Gynecology;  Laterality: Bilateral;   History reviewed. No pertinent family history. History  Substance Use Topics  . Smoking status: Former Smoker -- 0.10 packs/day for 1 years    Types: Cigarettes    Quit date: 12/27/1992  . Smokeless tobacco: Never Used  . Alcohol Use: Yes     Comment: socially   OB History   Grav Para Term Preterm Abortions TAB SAB Ect Mult Living                 Review of Systems  Constitutional: Negative for fever, chills, diaphoresis, activity change, appetite change and fatigue.  HENT: Negative for congestion, sore throat, facial swelling, rhinorrhea, neck pain and neck stiffness.   Eyes: Negative for photophobia and discharge.  Respiratory: Positive for cough (now resolved) and shortness of breath. Negative for sputum production and chest tightness.   Cardiovascular: Positive for chest pain. Negative for palpitations, leg swelling and syncope.  Gastrointestinal: Negative for nausea, vomiting, abdominal pain and  diarrhea.  Endocrine: Negative for polydipsia and polyuria.  Genitourinary: Negative for dysuria, frequency, difficulty urinating and pelvic pain.  Musculoskeletal: Negative for back pain and arthralgias.  Skin: Negative for color change and wound.  Allergic/Immunologic: Negative for immunocompromised state.  Neurological: Negative for facial asymmetry, weakness, numbness and headaches.  Hematological: Does not bruise/bleed easily.  Psychiatric/Behavioral: Negative for confusion and agitation.    Allergies  Azithromycin; Erythromycin; Penicillins; and  Sulfamethoxazole w-trimethoprim  Home Medications   Current Outpatient Rx  Name  Route  Sig  Dispense  Refill  . albuterol (PROVENTIL HFA;VENTOLIN HFA) 108 (90 BASE) MCG/ACT inhaler   Inhalation   Inhale 2 puffs into the lungs every 6 (six) hours as needed for wheezing.         Marland Kitchen aspirin 325 MG tablet   Oral   Take 325 mg by mouth daily.         . Calcium Carb-Cholecalciferol 500-400 MG-UNIT CHEW   Oral   Chew 1 each by mouth daily.          . cephALEXin (KEFLEX) 500 MG capsule   Oral   Take 1 capsule (500 mg total) by mouth 3 (three) times daily.   15 capsule   0   . Multiple Vitamins-Minerals (ADULT GUMMY) CHEW   Oral   Chew 2 tablets by mouth daily.          BP 101/42  Pulse 81  Temp(Src) 97.9 F (36.6 C) (Oral)  Resp 22  SpO2 100% Physical Exam  Constitutional: She is oriented to person, place, and time. She appears well-developed and well-nourished. No distress.  HENT:  Head: Normocephalic and atraumatic.  Mouth/Throat: No oropharyngeal exudate.  Eyes: Pupils are equal, round, and reactive to light.  Neck: Normal range of motion. Neck supple.  Cardiovascular: Normal rate, regular rhythm and normal heart sounds.  Exam reveals no gallop and no friction rub.   No murmur heard. Initial SBP 90's  Pulmonary/Chest: Effort normal and breath sounds normal. No respiratory distress. She has no wheezes. She has no rales.  Abdominal: Soft. Bowel sounds are normal. She exhibits no distension and no mass. There is no tenderness. There is no rebound and no guarding.  Musculoskeletal: Normal range of motion. She exhibits no edema and no tenderness.  Neurological: She is alert and oriented to person, place, and time.  Skin: Skin is warm and dry.  Psychiatric: She has a normal mood and affect.    ED Course  Procedures (including critical care time) Labs Review Labs Reviewed  BASIC METABOLIC PANEL - Abnormal; Notable for the following:    Glucose, Bld 106 (*)     All other components within normal limits  PRO B NATRIURETIC PEPTIDE - Abnormal; Notable for the following:    Pro B Natriuretic peptide (BNP) 132.3 (*)    All other components within normal limits  CBC  POCT I-STAT TROPONIN I   Imaging Review Dg Chest 2 View  09/16/2013   CLINICAL DATA:  Chest pain and shortness of breath  EXAM: CHEST  2 VIEW  COMPARISON:  September 13, 2013  FINDINGS: The lungs are clear. Heart size and pulmonary vascularity are normal. No adenopathy. No bone lesions. No pneumothorax.  IMPRESSION: No abnormality noted.   Electronically Signed   By: Bretta Bang   On: 09/16/2013 14:45     Date: 09/16/2013  Rate: 76  Rhythm: normal sinus rhythm  QRS Axis: normal  Intervals: normal  ST/T Wave abnormalities: normal  Conduction Disutrbances:  none  Narrative Interpretation: unremarkable      MDM   1. Dyspnea on exertion   2. Chest tightness    Pt is a 37 y.o. female with Pmhx as above who presents with dyspnea on exertion and chest tightness for 4-5 days.  Pt seen 9/18 for same, but also had URI symptoms and fever which have since resolved.  She was given IVF, PO keflex for UTI and had negative CT PE study.  On PE, pt has mild hypotension, but is afebrile, non-toxic appearing.  CXR, EKG unremarkable.  No significant trop of BNP elevation. Hb stable, Cr nml.  I believe symptoms likely related to viral illness diagnosed at last visit and doubt subsequent development of PE in the interim, ACS, pna, peri/myocarditis, CHF.  BP improved with IVF.  I believe pt safe for d/c.  I have recommended she drink plenty of fluids, continue keflex and PCP f/u for further w/u.  Return precautions given for new or worsening symptoms   1. Dyspnea on exertion   2. Chest tightness         Shanna Cisco, MD 09/16/13 1708

## 2013-09-20 LAB — CULTURE, BLOOD (ROUTINE X 2): Culture: NO GROWTH

## 2014-12-28 ENCOUNTER — Emergency Department (HOSPITAL_BASED_OUTPATIENT_CLINIC_OR_DEPARTMENT_OTHER)
Admission: EM | Admit: 2014-12-28 | Discharge: 2014-12-28 | Disposition: A | Discharge status: Home / Self Care | Payer: BC Managed Care – PPO | Attending: Emergency Medicine | Admitting: Emergency Medicine

## 2014-12-28 ENCOUNTER — Emergency Department (HOSPITAL_BASED_OUTPATIENT_CLINIC_OR_DEPARTMENT_OTHER): Payer: BC Managed Care – PPO

## 2014-12-28 ENCOUNTER — Encounter (HOSPITAL_BASED_OUTPATIENT_CLINIC_OR_DEPARTMENT_OTHER): Payer: Self-pay | Admitting: Emergency Medicine

## 2014-12-28 DIAGNOSIS — Z7982 Long term (current) use of aspirin: Secondary | ICD-10-CM | POA: Diagnosis not present

## 2014-12-28 DIAGNOSIS — Z87891 Personal history of nicotine dependence: Secondary | ICD-10-CM | POA: Diagnosis not present

## 2014-12-28 DIAGNOSIS — R079 Chest pain, unspecified: Secondary | ICD-10-CM

## 2014-12-28 DIAGNOSIS — J45909 Unspecified asthma, uncomplicated: Secondary | ICD-10-CM | POA: Diagnosis not present

## 2014-12-28 DIAGNOSIS — Z792 Long term (current) use of antibiotics: Secondary | ICD-10-CM | POA: Diagnosis not present

## 2014-12-28 DIAGNOSIS — Z87442 Personal history of urinary calculi: Secondary | ICD-10-CM | POA: Diagnosis not present

## 2014-12-28 DIAGNOSIS — I493 Ventricular premature depolarization: Secondary | ICD-10-CM | POA: Diagnosis not present

## 2014-12-28 DIAGNOSIS — Z86711 Personal history of pulmonary embolism: Secondary | ICD-10-CM | POA: Diagnosis not present

## 2014-12-28 LAB — CBC WITH DIFFERENTIAL/PLATELET
BASOS ABS: 0 10*3/uL (ref 0.0–0.1)
Basophils Relative: 0 % (ref 0–1)
Eosinophils Absolute: 0.1 10*3/uL (ref 0.0–0.7)
Eosinophils Relative: 1 % (ref 0–5)
HEMATOCRIT: 40.7 % (ref 36.0–46.0)
Hemoglobin: 13.9 g/dL (ref 12.0–15.0)
LYMPHS ABS: 3.5 10*3/uL (ref 0.7–4.0)
LYMPHS PCT: 33 % (ref 12–46)
MCH: 32.6 pg (ref 26.0–34.0)
MCHC: 34.2 g/dL (ref 30.0–36.0)
MCV: 95.3 fL (ref 78.0–100.0)
Monocytes Absolute: 0.7 10*3/uL (ref 0.1–1.0)
Monocytes Relative: 7 % (ref 3–12)
Neutro Abs: 6.2 10*3/uL (ref 1.7–7.7)
Neutrophils Relative %: 59 % (ref 43–77)
Platelets: 217 10*3/uL (ref 150–400)
RBC: 4.27 MIL/uL (ref 3.87–5.11)
RDW: 12.9 % (ref 11.5–15.5)
WBC: 10.6 10*3/uL — ABNORMAL HIGH (ref 4.0–10.5)

## 2014-12-28 LAB — COMPREHENSIVE METABOLIC PANEL
ALK PHOS: 63 U/L (ref 39–117)
ALT: 16 U/L (ref 0–35)
ANION GAP: 7 (ref 5–15)
AST: 21 U/L (ref 0–37)
Albumin: 4 g/dL (ref 3.5–5.2)
BUN: 15 mg/dL (ref 6–23)
CO2: 24 mmol/L (ref 19–32)
Calcium: 9 mg/dL (ref 8.4–10.5)
Chloride: 109 mEq/L (ref 96–112)
Creatinine, Ser: 0.77 mg/dL (ref 0.50–1.10)
GFR calc non Af Amer: >90 mL/min (ref >90)
GLUCOSE: 98 mg/dL (ref 70–99)
POTASSIUM: 3.6 mmol/L (ref 3.5–5.1)
SODIUM: 140 mmol/L (ref 135–145)
Total Bilirubin: 0.6 mg/dL (ref 0.3–1.2)
Total Protein: 7.7 g/dL (ref 6.0–8.3)

## 2014-12-28 LAB — LIPASE, BLOOD: Lipase: 41 U/L (ref 11–59)

## 2014-12-28 LAB — TROPONIN I

## 2014-12-28 LAB — D-DIMER, QUANTITATIVE: D-Dimer, Quant: 0.39 ug/mL-FEU (ref 0.00–0.48)

## 2014-12-28 MED ORDER — ZOLPIDEM TARTRATE 5 MG PO TABS: 5.0000 mg | ORAL_TABLET | Freq: Every evening | ORAL | Status: DC | PRN

## 2014-12-28 NOTE — Discharge Instructions (Signed)
Premature Ventricular Contraction Premature ventricular contraction (PVC) is an irregularity of the heart rhythm involving extra or skipped heartbeats. In some cases, they may occur without obvious cause or heart disease. Other times, they can be caused by an electrolyte change in the blood. These need to be corrected. They can also be seen when there is not enough oxygen going to the heart. A common cause of this is plaque or cholesterol buildup. This buildup decreases the blood supply to the heart. In addition, extra beats may be caused or aggravated by:  Excessive smoking.  Alcohol consumption.  Caffeine.  Certain medications  Some street drugs. SYMPTOMS   The sensation of feeling your heart skipping a beat (palpitations).  In many cases, the person may have no symptoms. SIGNS AND TESTS   A physical examination may show an occasional irregularity, but if the PVC beats do not happen often, they may not be found on physical exam.  Blood pressure is usually normal.  Other tests that may find extra beats of the heart are:  An EKG (electrocardiogram)  A Holter monitor which can monitor your heart over longer periods of time  An Angiogram (study of the heart arteries). TREATMENT  Usually extra heartbeats do not need treatment. The condition is treated only if symptoms are severe or if extra beats are very frequent or are causing problems. An underlying cause, if discovered, may also require treatment.  Treatment may also be needed if there may be a risk for other more serious cardiac arrhythmias.  PREVENTION   Moderation in caffeine, alcohol, and tobacco use may reduce the risk of ectopic heartbeats in some people.  Exercise often helps people who lead a sedentary (inactive) lifestyle. PROGNOSIS  PVC heartbeats are generally harmless and do not need treatment.  RISKS AND COMPLICATIONS   Ventricular tachycardia (occasionally).  There usually are no complications.  Other  arrhythmias (occasionally). SEEK IMMEDIATE MEDICAL CARE IF:   You feel palpitations that are frequent or continual.  You develop chest pain or other problems such as shortness of breath, sweating, or nausea and vomiting.  You become light-headed or faint (pass out).  You get worse or do not improve with treatment. Document Released: 07/30/2004 Document Revised: 03/06/2012 Document Reviewed: 02/09/2008 [employer] Patient Information 2015 [employer]. This information is not intended to replace advice given to you by your health care provider. Make sure you discuss any questions you have with your health care provider.  

## 2014-12-28 NOTE — ED Notes (Signed)
Pt reports recurrent chest pressure that has worsened over the last several episode and she further reports extensive cardiac Hx

## 2014-12-28 NOTE — ED Provider Notes (Signed)
CSN: [id number]     Arrival date & time 12/28/14  2046 History  This chart was scribed for Stacey Fossa, MD by Modena Jansky, ED Scribe. This patient was seen in room MH03/MH03 and the patient's care was started at 10:26 PM.  Chief Complaint  Patient presents with  . Chest Pain    The history is provided by the patient and a parent. No language interpreter was used.   HPI Comments: Stacey Myers is a 39 y.o. female with a hx of PE who presents to the Emergency Department complaining of constant moderate chest pain that started about a week ago. She reports that she is currently having PVCs that have worsened over the past week. She describes the pain as tight and pressure sensation. She states that she has some SOB with the pain. She reports having a hard time staying asleep. She denies any hx of smoking. She denies any fever, abdominal pain, vomiting, diarrhea, leg swelling or pain.  Past Medical History  Diagnosis Date  . Pulmonary embolism   . Asthma     rarely use - rescue inhaler  . Anxiety     no meds currently  . Depression     no meds currently  . Deep vein blood clot of left lower extremity 02/2011    dvt and pulmonary embolism  . Headache(784.0)   . History of kidney stones    Past Surgical History  Procedure Laterality Date  . Wisdom tooth extraction    . External fixature right arm    . Right wrist surgery    . Right knee surgery    . Cesarean section      x 1  . Lap band surgery    . Dilitation & currettage/hystroscopy with novasure ablation N/A 05/04/2013    Procedure: DILATATION & CURETTAGE/HYSTEROSCOPY WITH NOVASURE ABLATION;  Surgeon: Genia Del, MD;  Location: [facility];  Service: Gynecology;  Laterality: N/A;  . Laparoscopic tubal ligation Bilateral 05/04/2013    Procedure: LAPAROSCOPIC TUBAL LIGATION;  Surgeon: Genia Del, MD;  Location: [facility];  Service: Gynecology;  Laterality: Bilateral;   History reviewed. No pertinent family history. History   Substance Use Topics  . Smoking status: Former Smoker -- 0.10 packs/day for 1 years    Types: Cigarettes    Quit date: 12/27/1992  . Smokeless tobacco: Never Used  . Alcohol Use: Yes     Comment: socially   OB History    No data available     Review of Systems  Constitutional: Negative for fever.  Cardiovascular: Positive for chest pain. Negative for leg swelling.  Gastrointestinal: Negative for vomiting, abdominal pain and diarrhea.  Psychiatric/Behavioral: Positive for sleep disturbance.  All other systems reviewed and are negative.   Allergies  Azithromycin; Erythromycin; Penicillins; and Sulfamethoxazole-trimethoprim  Home Medications   Prior to Admission medications   Medication Sig Start Date End Date Taking? Authorizing Provider  albuterol (PROVENTIL HFA;VENTOLIN HFA) 108 (90 BASE) MCG/ACT inhaler Inhale 2 puffs into the lungs every 6 (six) hours as needed for wheezing.    Historical Provider, MD  aspirin 325 MG tablet Take 325 mg by mouth daily.    Historical Provider, MD  Calcium Carb-Cholecalciferol 500-400 MG-UNIT CHEW Chew 1 each by mouth daily.     Historical Provider, MD  cephALEXin (KEFLEX) 500 MG capsule Take 1 capsule (500 mg total) by mouth 3 (three) times daily. 09/13/13   Garlon Hatchet, PA-C  Multiple Vitamins-Minerals (ADULT GUMMY) CHEW Chew 2 tablets by mouth  daily.    Historical Provider, MD   BP 127/67 mmHg  Pulse 79  Temp(Src) 97.9 F (36.6 C) (Oral)  Resp 18  Wt 265 lb (120.203 kg)  SpO2 100% Physical Exam  Constitutional: She is oriented to person, place, and time. She appears well-developed and well-nourished.  HENT:  Head: Normocephalic and atraumatic.  Cardiovascular: Normal rate and regular rhythm.   No murmur heard. Pulmonary/Chest: Effort normal and breath sounds normal. No respiratory distress.  Abdominal: Soft. There is no tenderness. There is no rebound and no guarding.  Musculoskeletal: She exhibits no edema or tenderness.   Neurological: She is alert and oriented to person, place, and time.  Skin: Skin is warm and dry.  Psychiatric: She has a normal mood and affect. Her behavior is normal.  Nursing note and vitals reviewed.   ED Course  Procedures (including critical care time) DIAGNOSTIC STUDIES: Oxygen Saturation is 100% on RA, normal by my interpretation.    COORDINATION OF CARE: 10:30 PM- Pt advised of plan for treatment which includes radiology and labs and pt agrees.  Labs Review Labs Reviewed  CBC WITH DIFFERENTIAL - Abnormal; Notable for the following:    WBC 10.6 (*)    All other components within normal limits  COMPREHENSIVE METABOLIC PANEL  TROPONIN I  D-DIMER, QUANTITATIVE  LIPASE, BLOOD    Imaging Review Dg Chest 2 View  12/28/2014   CLINICAL DATA:  Chest pressure with shortness of breath for 3 hr. Several episodes over the last month.  EXAM: CHEST  2 VIEW  COMPARISON:  09/16/2013  FINDINGS: The heart size and mediastinal contours are within normal limits. Both lungs are clear. The visualized skeletal structures are unremarkable.  IMPRESSION: No active cardiopulmonary disease.   Electronically Signed   By: Burman Nieves M.D.   On: 12/28/2014 22:18     EKG Interpretation   Date/Time:  Saturday December 28 2014 20:59:58 EST Ventricular Rate:  78 PR Interval:  176 QRS Duration: 82 QT Interval:  382 QTC Calculation: 435 R Axis:   70 Text Interpretation:  Normal sinus rhythm Normal ECG Confirmed by Lincoln Brigham [phone]) on 12/28/2014 10:23:05 PM      MDM   Final diagnoses:  Chest pain  PVC's (premature ventricular contractions)   Patient here for evaluation of palpitations and chest tightness. She has a history of DVT and PE and is no longer on anticoagulation, but she is also off the estrogens that contributed to her PE in the first place. D-dimer is negative, current clinical picture is not consistent with PE, or ACS. Patient symptomatic with occasional PVCs on the monitor,  these are infrequent. Discussed with patient close cardiology follow-up as well as return precautions. Patient does have considerable difficulty sleeping, providing small prescription for Ambien to evaluate for relief.  I personally performed the services described in this documentation, which was scribed in my presence. The recorded information has been reviewed and is accurate.     Stacey Fossa, MD 12/29/14 [phone]

## 2015-02-12 ENCOUNTER — Ambulatory Visit: Payer: BC Managed Care – PPO | Admitting: Dietician

## 2015-02-17 ENCOUNTER — Ambulatory Visit: Payer: BC Managed Care – PPO | Admitting: Dietician

## 2016-05-12 ENCOUNTER — Ambulatory Visit: Payer: BC Managed Care – PPO | Attending: Physician Assistant | Admitting: Physical Therapy

## 2016-05-12 ENCOUNTER — Encounter: Payer: Self-pay | Admitting: Physical Therapy

## 2016-05-12 DIAGNOSIS — M5441 Lumbago with sciatica, right side: Secondary | ICD-10-CM | POA: Insufficient documentation

## 2016-05-12 DIAGNOSIS — R262 Difficulty in walking, not elsewhere classified: Secondary | ICD-10-CM | POA: Insufficient documentation

## 2016-05-12 NOTE — Therapy (Signed)
[facility] [address] [place], [place], [address] Phone: [phone]   Fax:  [phone]  Physical Therapy Evaluation  Patient Details  Name: Rolland Portermy Mickler MRN: [id number] Date of Birth: 1976/06/02 Referring Provider: Leonard SchwartzHoyt  Encounter Date: 05/12/2016      PT End of Session - 05/12/16 1638    Visit Number 1   Date for PT Re-Evaluation 07/12/16   PT Start Time 1615   PT Stop Time 1700   PT Time Calculation (min) 45 min   Activity Tolerance Patient tolerated treatment well   Behavior [facility] for tasks assessed/performed      Past Medical History  Diagnosis Date  . Pulmonary embolism (HCC)   . Asthma     rarely use - rescue inhaler  . Anxiety     no meds currently  . Depression     no meds currently  . Deep vein blood clot of left lower extremity (HCC) 02/2011    dvt and pulmonary embolism  . Headache(784.0)   . History of kidney stones     Past Surgical History  Procedure Laterality Date  . Wisdom tooth extraction    . External fixature right arm    . Right wrist surgery    . Right knee surgery    . Cesarean section      x 1  . Lap band surgery    . Dilitation & currettage/hystroscopy with novasure ablation N/A 05/04/2013    Procedure: DILATATION & CURETTAGE/HYSTEROSCOPY WITH NOVASURE ABLATION;  Surgeon: Genia DelMarie-Lyne Lavoie, MD;  Location: [facility];  Service: Gynecology;  Laterality: N/A;  . Laparoscopic tubal ligation Bilateral 05/04/2013    Procedure: LAPAROSCOPIC TUBAL LIGATION;  Surgeon: Genia DelMarie-Lyne Lavoie, MD;  Location: [facility];  Service: Gynecology;  Laterality: Bilateral;    There were no vitals filed for this visit.       Subjective Assessment - 05/12/16 1615    Subjective Patient reports she has had low back pain with some right leg and buttock pain, she will be having an MRI tomorrow.  Reports pain started in March when she rolled over in bed and felt a pop.  She reports that she took  prednisone that helped for about a week.  But pain has persisted.   Limitations Walking;House hold activities   How long can you walk comfortably? can't walk > 1/4 mile due to pain   Patient Stated Goals have less pain   Currently in Pain? Yes   Pain Score 2    Pain Location Back   Pain Orientation Right;Lower   Pain Descriptors / Indicators Stabbing;Pins and needles   Pain Type Acute pain   Pain Onset More than a month ago   Pain Frequency Constant   Aggravating Factors  walking > 300 feet pain a 9/10, lying on back, standing 10-15 minutes all will increase pain to 9/10   Pain Relieving Factors sit and rest, lay on the left side at best pain a 2/10   Effect of Pain on Daily Activities difficulty walking and standing            [facility] PT Assessment - 05/12/16 0001    Assessment   Medical Diagnosis LBP with right side sciatica   Referring Provider Leonard SchwartzHoyt   Onset Date/Surgical Date 04/12/16   Precautions   Precautions None   Balance Screen   Has the patient fallen in the past 6 months No   Has the patient had a decrease  in activity level because of a fear of falling?  No   Is the patient reluctant to leave their home because of a fear of falling?  No   Home Environment   Additional Comments has stairs at home, does her own housework   Prior Function   Level of Independence Independent   Vocation Full time employment   Vocation Requirements sitting, standing, but has to walk a distance to get to her office   Leisure no exercise   Posture/Postural Control   Posture Comments fwd head, rounded shoulders   ROM / Strength   AROM / PROM / Strength AROM;Strength   AROM   Overall AROM Comments Lumbar ROM decreased 25% with pain in the right low back with extension and right side bending   Strength   Overall Strength Comments Hips and knees are 4/5, right DF is 4-/5   Flexibility   Soft Tissue Assessment /Muscle Length --  very tight HS, ITB, piriformis and calves   Palpation    Palpation comment reports decreased sensation on the top of the right foot, she is tender over the right greater trochanter, very tender right ITB   Special Tests    Special Tests --  + SLR on the right at45 degrees   Ambulation/Gait   Gait Comments gait is antalgic on the right with trunk lean over the right with mid stance phase                   [facility] Adult PT Treatment/Exercise - 05/12/16 0001    Modalities   Modalities Electrical Stimulation;Moist Heat   Moist Heat Therapy   Number Minutes Moist Heat 15 Minutes   Moist Heat Location Hip   Electrical Stimulation   Electrical Stimulation Location right hip area   Electrical Stimulation Action IFC   Electrical Stimulation Parameters sitting   Electrical Stimulation Goals Pain                PT Education - 05/12/16 1638    Education provided Yes   Education Details HS, piriformis and ITB stretches   Person(s) Educated Patient   Methods Explanation;Demonstration;Handout   Comprehension Verbalized understanding          PT Short Term Goals - 05/12/16 1641    PT SHORT TERM GOAL #1   Title independent with initial HEP   Time 1   Period Weeks   Status New           PT Long Term Goals - 05/12/16 1641    PT LONG TERM GOAL #1   Title decrease pain 50%   Time 8   Period Weeks   Status New   PT LONG TERM GOAL #2   Title walk to her office without stopping   Time 8   Period Weeks   Status New   PT LONG TERM GOAL #3   Title increase ROM of the lumbar spine to WFL's   Time 8   Period Weeks   Status New   PT LONG TERM GOAL #4   Title increase right ankle DF to 4/+/5   Time 8   Period Weeks   Status New               Plan - 05/12/16 1639    Clinical Impression Statement Patient with right hip pain and some c/o numbness on the top of the right foot.  She is very tight in the ITB, piriformis and HS.  + SLR on  the right at 45 degrees.  She is to have MRI tomorrow.  Biggest issue is she  cannot walk greater than 300 feet due to right hip pain   Rehab Potential Good   PT Frequency 2x / week   PT Duration 8 weeks   PT Treatment/Interventions ADLs/Self Care Home Management;Electrical Stimulation;Moist Heat;Therapeutic exercise;Ultrasound;Patient/family education;Therapeutic activities;Manual techniques   PT Next Visit Plan slowly add flexibility and core exercises   Consulted and Agree with Plan of Care Patient      Patient will benefit from skilled therapeutic intervention in order to improve the following deficits and impairments:  Abnormal gait, Decreased activity tolerance, Decreased mobility, Decreased range of motion, Decreased strength, Difficulty walking, Impaired flexibility, Pain, Increased muscle spasms  Visit Diagnosis: Right-sided low back pain with right-sided sciatica - Plan: PT plan of care cert/re-cert  Difficulty in walking, not elsewhere classified - Plan: PT plan of care cert/re-cert     Problem List Patient Active Problem List   Diagnosis Date Noted  . ACUTE FRONTAL SINUSITIS 08/06/2008  . OTITIS MEDIA, SUPPURATIVE, ACUTE, LEFT 11/03/2007  . VIRAL URI 10/27/2007  . DEPRESSION 08/31/2007  . HEADACHE 08/31/2007    Jearld Lesch., PT 05/12/2016, 4:46 PM  [facility] [address] [place], [place], [address] Phone: [phone]   Fax:  [phone]  Name: Tamarah Bhullar MRN: [id number] Date of Birth: 05-28-1976

## 2016-05-18 ENCOUNTER — Ambulatory Visit: Payer: BC Managed Care – PPO | Admitting: Physical Therapy

## 2016-05-20 ENCOUNTER — Telehealth: Payer: Self-pay

## 2016-05-20 ENCOUNTER — Ambulatory Visit: Payer: Self-pay | Admitting: Orthopedic Surgery

## 2016-05-20 ENCOUNTER — Other Ambulatory Visit (HOSPITAL_COMMUNITY): Payer: Self-pay | Admitting: Specialist

## 2016-05-20 NOTE — Telephone Encounter (Signed)
05/20/16 patient cxl'd PT appts, having surgery

## 2016-05-20 NOTE — Patient Instructions (Signed)
Stacey Myers  05/20/2016   Your procedure is scheduled on: 05/26/16  [facility]  Entrance [place]  elevators to 3rd floor to  Short Stay Center at  [phone].  Call this number if you have problems the morning of surgery [phone]   Remember: ONLY 1 PERSON MAY GO WITH YOU TO SHORT STAY TO GET  READY MORNING OF YOUR SURGERY.  Do not eat food or drink liquids :After Midnight.     Take these medicines the morning of surgery with A SIP OF WATER: NONE DO NOT TAKE ANY DIABETIC MEDICATIONS DAY OF YOUR SURGERY                               You may not have any metal on your body including hair pins and              piercings  Do not wear jewelry, make-up, lotions, powders or perfumes, deodorant             Do not wear nail polish.  Do not shave  48 hours prior to surgery.              Men may shave face and neck.   Do not bring valuables to the hospital. [facility] IS NOT             RESPONSIBLE   FOR VALUABLES.  Contacts, dentures or bridgework may not be worn into surgery.  Leave suitcase in the car. After surgery it may be brought to your room.               Please read over the following fact sheets you were given: _____________________________________________________________________             [facility] - Preparing for [facility] Before surgery, you can play an important role.  Because skin is not sterile, your skin needs to be as free of germs as possible.  You can reduce the number of germs on your skin by washing with CHG (chlorahexidine gluconate) soap before surgery.  CHG is an antiseptic cleaner which kills germs and bonds with the skin to continue killing germs even after washing. Please DO NOT use if you have an allergy to CHG or antibacterial soaps.  If your skin becomes reddened/irritated stop using the CHG and inform your nurse when you arrive at Short Stay. Do not shave (including legs and underarms) for at least 48 hours prior to  the first CHG shower.  You may shave your face/neck. Please follow these instructions carefully:  1.  Shower with CHG Soap the night before surgery and the  morning of Surgery.  2.  If you choose to wash your hair, wash your hair first as usual with your  normal  shampoo.  3.  After you shampoo, rinse your hair and body thoroughly to remove the  shampoo.                           4.  Use CHG as you would any other liquid soap.  You can apply chg directly  to the skin and wash                       Gently with a scrungie or clean washcloth.  5.  Apply the CHG Soap to your body ONLY FROM THE NECK DOWN.   Do not use on face/ open                           Wound or open sores. Avoid contact with eyes, ears mouth and genitals (private parts).                       Wash face,  Genitals (private parts) with your normal soap.             6.  Wash thoroughly, paying special attention to the area where your surgery  will be performed.  7.  Thoroughly rinse your body with warm water from the neck down.  8.  DO NOT shower/wash with your normal soap after using and rinsing off  the CHG Soap.                9.  Pat yourself dry with a clean towel.            10.  Wear clean pajamas.            11.  Place clean sheets on your bed the night of your first shower and do not  sleep with pets. Day of Surgery : Do not apply any lotions/deodorants the morning of surgery.  Please wear clean clothes to the hospital/surgery center.  FAILURE TO FOLLOW THESE INSTRUCTIONS MAY RESULT IN THE CANCELLATION OF YOUR SURGERY PATIENT SIGNATURE_________________________________  NURSE SIGNATURE__________________________________  ________________________________________________________________________   Stacey Myers  An incentive spirometer is a tool that can help keep your lungs clear and active. This tool measures how well you are filling your lungs with each breath. Taking long deep breaths may help reverse or  decrease the chance of developing breathing (pulmonary) problems (especially infection) following:  A long period of time when you are unable to move or be active. BEFORE THE PROCEDURE   If the spirometer includes an indicator to show your best effort, your nurse or respiratory therapist will set it to a desired goal.  If possible, sit up straight or lean slightly forward. Try not to slouch.  Hold the incentive spirometer in an upright position. INSTRUCTIONS FOR USE   Sit on the edge of your bed if possible, or sit up as far as you can in bed or on a chair.  Hold the incentive spirometer in an upright position.  Breathe out normally.  Place the mouthpiece in your mouth and seal your lips tightly around it.  Breathe in slowly and as deeply as possible, raising the piston or the ball toward the top of the column.  Hold your breath for 3-5 seconds or for as long as possible. Allow the piston or ball to fall to the bottom of the column.  Remove the mouthpiece from your mouth and breathe out normally.  Rest for a few seconds and repeat Steps 1 through 7 at least 10 times every 1-2 hours when you are awake. Take your time and take a few normal breaths between deep breaths.  The spirometer may include an indicator to show your best effort. Use the indicator as a goal to work toward during each repetition.  After each set of 10 deep breaths, practice coughing to be sure your lungs are clear. If you have an incision (the cut made at the time of surgery), support your incision when coughing by placing a pillow or  rolled up towels firmly against it. Once you are able to get out of bed, walk around indoors and cough well. You may stop using the incentive spirometer when instructed by your caregiver.  RISKS AND COMPLICATIONS  Take your time so you do not get dizzy or light-headed.  If you are in pain, you may need to take or ask for pain medication before doing incentive spirometry. It is  harder to take a deep breath if you are having pain. AFTER USE  Rest and breathe slowly and easily.  It can be helpful to keep track of a log of your progress. Your caregiver can provide you with a simple table to help with this. If you are using the spirometer at home, follow these instructions: [place] IF:   You are having difficultly using the spirometer.  You have trouble using the spirometer as often as instructed.  Your pain medication is not giving enough relief while using the spirometer.  You develop fever of 100.5 F (38.1 C) or higher. SEEK IMMEDIATE MEDICAL CARE IF:   You cough up bloody sputum that had not been present before.  You develop fever of 102 F (38.9 C) or greater.  You develop worsening pain at or near the incision site. MAKE SURE YOU:   Understand these instructions.  Will watch your condition.  Will get help right away if you are not doing well or get worse. Document Released: 04/25/2007 Document Revised: 03/06/2012 Document Reviewed: 06/26/2007 Exit[employer] Patient Information 2014 [employer], [place].   ________________________________________________________________________  WHAT IS A BLOOD TRANSFUSION? Blood Transfusion Information  A transfusion is the replacement of blood or some of its parts. Blood is made up of multiple cells which provide different functions.  Red blood cells carry oxygen and are used for blood loss replacement.  White blood cells fight against infection.  Platelets control bleeding.  Plasma helps clot blood.  Other blood products are available for specialized needs, such as hemophilia or other clotting disorders. BEFORE THE TRANSFUSION  Who gives blood for transfusions?   Healthy volunteers who are fully evaluated to make sure their blood is safe. This is blood bank blood. Transfusion therapy is the safest it has ever been in the practice of medicine. Before blood is taken from a donor, a complete history  is taken to make sure that person has no history of diseases nor engages in risky social behavior (examples are intravenous drug use or sexual activity with multiple partners). The donor's travel history is screened to minimize risk of transmitting infections, such as malaria. The donated blood is tested for signs of infectious diseases, such as HIV and hepatitis. The blood is then tested to be sure it is compatible with you in order to minimize the chance of a transfusion reaction. If you or a relative donates blood, this is often done in anticipation of surgery and is not appropriate for emergency situations. It takes many days to process the donated blood. RISKS AND COMPLICATIONS Although transfusion therapy is very safe and saves many lives, the main dangers of transfusion include:   Getting an infectious disease.  Developing a transfusion reaction. This is an allergic reaction to something in the blood you were given. Every precaution is taken to prevent this. The decision to have a blood transfusion has been considered carefully by your caregiver before blood is given. Blood is not given unless the benefits outweigh the risks. AFTER THE TRANSFUSION  Right after receiving a blood transfusion, you will usually  feel much better and more energetic. This is especially true if your red blood cells have gotten low (anemic). The transfusion raises the level of the red blood cells which carry oxygen, and this usually causes an energy increase.  The nurse administering the transfusion will monitor you carefully for complications. HOME CARE INSTRUCTIONS  No special instructions are needed after a transfusion. You may find your energy is better. Speak with your caregiver about any limitations on activity for underlying diseases you may have. SEEK MEDICAL CARE IF:   Your condition is not improving after your transfusion.  You develop redness or irritation at the intravenous (IV) site. SEEK IMMEDIATE  MEDICAL CARE IF:  Any of the following symptoms occur over the next 12 hours:  Shaking chills.  You have a temperature by mouth above 102 F (38.9 C), not controlled by medicine.  Chest, back, or muscle pain.  People around you feel you are not acting correctly or are confused.  Shortness of breath or difficulty breathing.  Dizziness and fainting.  You get a rash or develop hives.  You have a decrease in urine output.  Your urine turns a dark color or changes to pink, red, or brown. Any of the following symptoms occur over the next 10 days:  You have a temperature by mouth above 102 F (38.9 C), not controlled by medicine.  Shortness of breath.  Weakness after normal activity.  The white part of the eye turns yellow (jaundice).  You have a decrease in the amount of urine or are urinating less often.  Your urine turns a dark color or changes to pink, red, or brown. Document Released: 12/10/2000 Document Revised: 03/06/2012 Document Reviewed: 07/29/2008 [facility] Patient Information 2014 [place], [place].  _______________________________________________________________________

## 2016-05-21 ENCOUNTER — Ambulatory Visit: Payer: BC Managed Care – PPO | Admitting: Physical Therapy

## 2016-05-21 ENCOUNTER — Encounter (HOSPITAL_COMMUNITY): Payer: Self-pay

## 2016-05-21 ENCOUNTER — Encounter (HOSPITAL_COMMUNITY)
Admission: RE | Admit: 2016-05-21 | Discharge: 2016-05-21 | Disposition: A | Discharge status: Home / Self Care | Payer: BC Managed Care – PPO | Source: Ambulatory Visit | Attending: Specialist | Admitting: Specialist

## 2016-05-21 ENCOUNTER — Ambulatory Visit (HOSPITAL_COMMUNITY)
Admission: RE | Admit: 2016-05-21 | Discharge: 2016-05-21 | Disposition: A | Discharge status: Home / Self Care | Payer: BC Managed Care – PPO | Source: Ambulatory Visit | Attending: Orthopedic Surgery | Admitting: Orthopedic Surgery

## 2016-05-21 DIAGNOSIS — M5126 Other intervertebral disc displacement, lumbar region: Secondary | ICD-10-CM | POA: Diagnosis present

## 2016-05-21 DIAGNOSIS — Z9884 Bariatric surgery status: Secondary | ICD-10-CM | POA: Diagnosis not present

## 2016-05-21 LAB — CBC
HCT: 39.8 % (ref 36.0–46.0)
Hemoglobin: 13.9 g/dL (ref 12.0–15.0)
MCH: 32.1 pg (ref 26.0–34.0)
MCHC: 34.9 g/dL (ref 30.0–36.0)
MCV: 91.9 fL (ref 78.0–100.0)
PLATELETS: 223 10*3/uL (ref 150–400)
RBC: 4.33 MIL/uL (ref 3.87–5.11)
RDW: 12.8 % (ref 11.5–15.5)
WBC: 7.2 10*3/uL (ref 4.0–10.5)

## 2016-05-21 LAB — BASIC METABOLIC PANEL
Anion gap: 5 (ref 5–15)
BUN: 8 mg/dL (ref 6–20)
CALCIUM: 8.7 mg/dL — AB (ref 8.9–10.3)
CO2: 24 mmol/L (ref 22–32)
CREATININE: 0.74 mg/dL (ref 0.44–1.00)
Chloride: 110 mmol/L (ref 101–111)
GFR calc non Af Amer: >60 mL/min (ref >60)
Glucose, Bld: 98 mg/dL (ref 65–99)
Potassium: 4.1 mmol/L (ref 3.5–5.1)
SODIUM: 139 mmol/L (ref 135–145)

## 2016-05-21 LAB — SURGICAL PCR SCREEN
MRSA, PCR: NEGATIVE
STAPHYLOCOCCUS AUREUS: NEGATIVE

## 2016-05-21 LAB — HCG, SERUM, QUALITATIVE: PREG SERUM: NEGATIVE

## 2016-05-21 LAB — ABO/RH: ABO/RH(D): A NEG

## 2016-05-21 NOTE — Progress Notes (Signed)
Spoke with dr Ivin Bootyrews at pre op visit- stated EKG not necessary today. Patient states saw cardio for PVC'S last year.  States has not had any problems since last visit.  Recoreds on chart as follows OV Dr Hanley Haysosario x 3 visits holter monitor study eccho  1/16 Lower leg vascular studies x 4 Ekg,s 3/16

## 2016-05-25 ENCOUNTER — Ambulatory Visit: Payer: BC Managed Care – PPO | Admitting: Physical Therapy

## 2016-05-25 MED ORDER — DEXTROSE 5 % IV SOLN
3.0000 g | INTRAVENOUS | Status: AC
  Administered 2016-05-26: 3 g via INTRAVENOUS
  Filled 2016-05-25: qty 3

## 2016-05-25 NOTE — Progress Notes (Signed)
Had requested last stress test report from  [facility](pt states had one there in past few years).  Received note they do not have record of this patient

## 2016-05-26 ENCOUNTER — Ambulatory Visit (HOSPITAL_COMMUNITY): Payer: BC Managed Care – PPO | Admitting: Certified Registered Nurse Anesthetist

## 2016-05-26 ENCOUNTER — Ambulatory Visit (HOSPITAL_COMMUNITY)
Admission: RE | Admit: 2016-05-26 | Discharge: 2016-05-27 | Disposition: A | Discharge status: Home / Self Care | Payer: BC Managed Care – PPO | Source: Ambulatory Visit | Attending: Specialist | Admitting: Specialist

## 2016-05-26 ENCOUNTER — Ambulatory Visit (HOSPITAL_COMMUNITY): Payer: BC Managed Care – PPO

## 2016-05-26 ENCOUNTER — Encounter (HOSPITAL_COMMUNITY)
Admission: RE | Disposition: A | Discharge status: Home / Self Care | Payer: Self-pay | Source: Ambulatory Visit | Attending: Specialist

## 2016-05-26 ENCOUNTER — Encounter (HOSPITAL_COMMUNITY): Payer: Self-pay | Admitting: *Deleted

## 2016-05-26 DIAGNOSIS — Z87442 Personal history of urinary calculi: Secondary | ICD-10-CM | POA: Insufficient documentation

## 2016-05-26 DIAGNOSIS — F419 Anxiety disorder, unspecified: Secondary | ICD-10-CM | POA: Diagnosis not present

## 2016-05-26 DIAGNOSIS — Z86711 Personal history of pulmonary embolism: Secondary | ICD-10-CM | POA: Insufficient documentation

## 2016-05-26 DIAGNOSIS — Z87891 Personal history of nicotine dependence: Secondary | ICD-10-CM | POA: Diagnosis not present

## 2016-05-26 DIAGNOSIS — Z888 Allergy status to other drugs, medicaments and biological substances status: Secondary | ICD-10-CM | POA: Insufficient documentation

## 2016-05-26 DIAGNOSIS — Z86718 Personal history of other venous thrombosis and embolism: Secondary | ICD-10-CM | POA: Insufficient documentation

## 2016-05-26 DIAGNOSIS — F329 Major depressive disorder, single episode, unspecified: Secondary | ICD-10-CM | POA: Insufficient documentation

## 2016-05-26 DIAGNOSIS — M48061 Spinal stenosis, lumbar region without neurogenic claudication: Secondary | ICD-10-CM | POA: Diagnosis present

## 2016-05-26 DIAGNOSIS — Z881 Allergy status to other antibiotic agents status: Secondary | ICD-10-CM | POA: Diagnosis not present

## 2016-05-26 DIAGNOSIS — Z880 Allergy status to penicillin: Secondary | ICD-10-CM | POA: Insufficient documentation

## 2016-05-26 DIAGNOSIS — J45909 Unspecified asthma, uncomplicated: Secondary | ICD-10-CM | POA: Diagnosis not present

## 2016-05-26 DIAGNOSIS — Z882 Allergy status to sulfonamides status: Secondary | ICD-10-CM | POA: Diagnosis not present

## 2016-05-26 DIAGNOSIS — Z7982 Long term (current) use of aspirin: Secondary | ICD-10-CM | POA: Insufficient documentation

## 2016-05-26 DIAGNOSIS — M4806 Spinal stenosis, lumbar region: Secondary | ICD-10-CM | POA: Diagnosis not present

## 2016-05-26 DIAGNOSIS — M5126 Other intervertebral disc displacement, lumbar region: Secondary | ICD-10-CM

## 2016-05-26 DIAGNOSIS — Z6841 Body Mass Index (BMI) 40.0 and over, adult: Secondary | ICD-10-CM | POA: Insufficient documentation

## 2016-05-26 DIAGNOSIS — Z419 Encounter for procedure for purposes other than remedying health state, unspecified: Secondary | ICD-10-CM

## 2016-05-26 HISTORY — PX: LUMBAR LAMINECTOMY/DECOMPRESSION MICRODISCECTOMY: SHX5026

## 2016-05-26 LAB — TYPE AND SCREEN
ABO/RH(D): A NEG
Antibody Screen: NEGATIVE

## 2016-05-26 SURGERY — LUMBAR LAMINECTOMY/DECOMPRESSION MICRODISCECTOMY
Anesthesia: General | Laterality: Right

## 2016-05-26 MED ORDER — ACETAMINOPHEN 325 MG PO TABS: 650.0000 mg | ORAL_TABLET | ORAL | Status: DC | PRN

## 2016-05-26 MED ORDER — POLYETHYLENE GLYCOL 3350 17 G PO PACK: 17.0000 g | PACK | Freq: Every day | ORAL | Status: DC | PRN

## 2016-05-26 MED ORDER — ROCURONIUM BROMIDE 100 MG/10ML IV SOLN
INTRAVENOUS | Status: AC
  Filled 2016-05-26: qty 1

## 2016-05-26 MED ORDER — LACTATED RINGERS IV SOLN
INTRAVENOUS | Status: DC
  Administered 2016-05-26 (×2): via INTRAVENOUS

## 2016-05-26 MED ORDER — MIDAZOLAM HCL 5 MG/5ML IJ SOLN
INTRAMUSCULAR | Status: DC | PRN
  Administered 2016-05-26: 2 mg via INTRAVENOUS

## 2016-05-26 MED ORDER — DEXTROSE 5 % IV SOLN
3.0000 g | Freq: Three times a day (TID) | INTRAVENOUS | Status: AC
  Administered 2016-05-26 – 2016-05-27 (×2): 3 g via INTRAVENOUS
  Filled 2016-05-26 (×2): qty 3

## 2016-05-26 MED ORDER — DEXAMETHASONE SODIUM PHOSPHATE 10 MG/ML IJ SOLN
INTRAMUSCULAR | Status: DC | PRN
  Administered 2016-05-26: 10 mg via INTRAVENOUS

## 2016-05-26 MED ORDER — HYDROMORPHONE HCL 1 MG/ML IJ SOLN
0.5000 mg | INTRAMUSCULAR | Status: DC | PRN
Start: 2016-05-26 — End: 2016-05-27
  Filled 2016-05-26: qty 1

## 2016-05-26 MED ORDER — LIDOCAINE HCL (CARDIAC) 20 MG/ML IV SOLN
INTRAVENOUS | Status: DC | PRN
  Administered 2016-05-26: 100 mg via INTRAVENOUS

## 2016-05-26 MED ORDER — FENTANYL CITRATE (PF) 100 MCG/2ML IJ SOLN
INTRAMUSCULAR | Status: AC
  Filled 2016-05-26: qty 2

## 2016-05-26 MED ORDER — SUGAMMADEX SODIUM 200 MG/2ML IV SOLN
INTRAVENOUS | Status: AC
  Filled 2016-05-26: qty 2

## 2016-05-26 MED ORDER — FENTANYL CITRATE (PF) 100 MCG/2ML IJ SOLN
INTRAMUSCULAR | Status: DC | PRN
  Administered 2016-05-26 (×5): 50 ug via INTRAVENOUS

## 2016-05-26 MED ORDER — PROPOFOL 10 MG/ML IV BOLUS
INTRAVENOUS | Status: AC
  Filled 2016-05-26: qty 20

## 2016-05-26 MED ORDER — HYDROMORPHONE HCL 1 MG/ML IJ SOLN
INTRAMUSCULAR | Status: AC
  Filled 2016-05-26: qty 1

## 2016-05-26 MED ORDER — ONDANSETRON HCL 4 MG/2ML IJ SOLN
INTRAMUSCULAR | Status: AC
  Filled 2016-05-26: qty 2

## 2016-05-26 MED ORDER — METHOCARBAMOL 500 MG PO TABS: 500.0000 mg | ORAL_TABLET | Freq: Four times a day (QID) | ORAL | Status: DC | PRN

## 2016-05-26 MED ORDER — DOCUSATE SODIUM 100 MG PO CAPS: 100.0000 mg | ORAL_CAPSULE | Freq: Two times a day (BID) | ORAL | Status: AC | PRN

## 2016-05-26 MED ORDER — MENTHOL 3 MG MT LOZG
1.0000 | LOZENGE | OROMUCOSAL | Status: DC | PRN
  Filled 2016-05-26: qty 9

## 2016-05-26 MED ORDER — ROCURONIUM BROMIDE 100 MG/10ML IV SOLN
INTRAVENOUS | Status: DC | PRN
  Administered 2016-05-26: 10 mg via INTRAVENOUS
  Administered 2016-05-26: 5 mg via INTRAVENOUS
  Administered 2016-05-26: 50 mg via INTRAVENOUS

## 2016-05-26 MED ORDER — ONDANSETRON HCL 4 MG/2ML IJ SOLN: 4.0000 mg | INTRAMUSCULAR | Status: DC | PRN

## 2016-05-26 MED ORDER — MIDAZOLAM HCL 2 MG/2ML IJ SOLN
INTRAMUSCULAR | Status: AC
  Filled 2016-05-26: qty 2

## 2016-05-26 MED ORDER — DEXAMETHASONE SODIUM PHOSPHATE 10 MG/ML IJ SOLN
INTRAMUSCULAR | Status: AC
  Filled 2016-05-26: qty 1

## 2016-05-26 MED ORDER — LIDOCAINE HCL (CARDIAC) 20 MG/ML IV SOLN
INTRAVENOUS | Status: AC
  Filled 2016-05-26: qty 5

## 2016-05-26 MED ORDER — ACETAMINOPHEN 650 MG RE SUPP: 650.0000 mg | RECTAL | Status: DC | PRN

## 2016-05-26 MED ORDER — HYDROMORPHONE HCL 1 MG/ML IJ SOLN
0.2500 mg | INTRAMUSCULAR | Status: DC | PRN
  Administered 2016-05-26 (×2): 0.5 mg via INTRAVENOUS

## 2016-05-26 MED ORDER — BUPIVACAINE-EPINEPHRINE (PF) 0.5% -1:200000 IJ SOLN
INTRAMUSCULAR | Status: AC
  Filled 2016-05-26: qty 30

## 2016-05-26 MED ORDER — OXYCODONE-ACETAMINOPHEN 5-325 MG PO TABS: 1.0000 | ORAL_TABLET | ORAL | Status: AC | PRN

## 2016-05-26 MED ORDER — SODIUM CHLORIDE 0.9 % IR SOLN
Status: AC
  Filled 2016-05-26: qty 1

## 2016-05-26 MED ORDER — BISACODYL 5 MG PO TBEC: 5.0000 mg | DELAYED_RELEASE_TABLET | Freq: Every day | ORAL | Status: DC | PRN

## 2016-05-26 MED ORDER — ONDANSETRON HCL 4 MG/2ML IJ SOLN
INTRAMUSCULAR | Status: DC | PRN
  Administered 2016-05-26: 4 mg via INTRAVENOUS

## 2016-05-26 MED ORDER — OXYCODONE-ACETAMINOPHEN 5-325 MG PO TABS
1.0000 | ORAL_TABLET | ORAL | Status: DC | PRN
Start: 2016-05-26 — End: 2016-05-27

## 2016-05-26 MED ORDER — DOCUSATE SODIUM 100 MG PO CAPS
100.0000 mg | ORAL_CAPSULE | Freq: Two times a day (BID) | ORAL | Status: DC
  Administered 2016-05-26 – 2016-05-27 (×2): 100 mg via ORAL
  Filled 2016-05-26 (×4): qty 1

## 2016-05-26 MED ORDER — PHENOL 1.4 % MT LIQD
1.0000 | OROMUCOSAL | Status: DC | PRN
  Filled 2016-05-26: qty 177

## 2016-05-26 MED ORDER — FENTANYL CITRATE (PF) 250 MCG/5ML IJ SOLN
INTRAMUSCULAR | Status: AC
  Filled 2016-05-26: qty 5

## 2016-05-26 MED ORDER — BUPIVACAINE-EPINEPHRINE 0.5% -1:200000 IJ SOLN
INTRAMUSCULAR | Status: DC | PRN
  Administered 2016-05-26: 10 mL

## 2016-05-26 MED ORDER — ALUM & MAG HYDROXIDE-SIMETH 200-200-20 MG/5ML PO SUSP: 30.0000 mL | Freq: Four times a day (QID) | ORAL | Status: DC | PRN

## 2016-05-26 MED ORDER — SODIUM CHLORIDE 0.9 % IR SOLN
Status: DC | PRN
  Administered 2016-05-26

## 2016-05-26 MED ORDER — MAGNESIUM CITRATE PO SOLN: 1.0000 | Freq: Once | ORAL | Status: DC | PRN

## 2016-05-26 MED ORDER — HYDROCODONE-ACETAMINOPHEN 5-325 MG PO TABS
1.0000 | ORAL_TABLET | ORAL | Status: DC | PRN
  Administered 2016-05-26 – 2016-05-27 (×2): 1 via ORAL
  Filled 2016-05-26: qty 2
  Filled 2016-05-26: qty 1

## 2016-05-26 MED ORDER — DEXTROSE 5 % IV SOLN
500.0000 mg | Freq: Four times a day (QID) | INTRAVENOUS | Status: DC | PRN
  Administered 2016-05-26: 500 mg via INTRAVENOUS
  Filled 2016-05-26: qty 550
  Filled 2016-05-26: qty 5

## 2016-05-26 MED ORDER — METOCLOPRAMIDE HCL 5 MG/ML IJ SOLN
5.0000 mg | Freq: Four times a day (QID) | INTRAMUSCULAR | Status: DC | PRN
  Administered 2016-05-26: 10 mg via INTRAVENOUS
  Filled 2016-05-26: qty 2

## 2016-05-26 MED ORDER — RISAQUAD PO CAPS
1.0000 | ORAL_CAPSULE | Freq: Every day | ORAL | Status: DC
  Administered 2016-05-27: 1 via ORAL
  Filled 2016-05-26 (×2): qty 1

## 2016-05-26 MED ORDER — METHOCARBAMOL 500 MG PO TABS: 500.0000 mg | ORAL_TABLET | Freq: Four times a day (QID) | ORAL | Status: AC | PRN

## 2016-05-26 MED ORDER — PROPOFOL 10 MG/ML IV BOLUS
INTRAVENOUS | Status: DC | PRN
  Administered 2016-05-26: 160 mg via INTRAVENOUS

## 2016-05-26 MED ORDER — SUGAMMADEX SODIUM 200 MG/2ML IV SOLN
INTRAVENOUS | Status: DC | PRN
  Administered 2016-05-26: 200 mg via INTRAVENOUS

## 2016-05-26 MED ORDER — GABAPENTIN 300 MG PO CAPS
600.0000 mg | ORAL_CAPSULE | Freq: Every day | ORAL | Status: DC
Start: 2016-05-26 — End: 2016-05-27
  Administered 2016-05-26: 600 mg via ORAL
  Filled 2016-05-26 (×2): qty 2

## 2016-05-26 MED ORDER — KCL IN DEXTROSE-NACL 20-5-0.45 MEQ/L-%-% IV SOLN
INTRAVENOUS | Status: AC
  Administered 2016-05-26: 18:00:00 via INTRAVENOUS
  Filled 2016-05-26 (×2): qty 1000

## 2016-05-26 SURGICAL SUPPLY — 44 items
BAG ZIPLOCK 12X15 (MISCELLANEOUS) IMPLANT
CLOSURE WOUND 1/2 X4 (GAUZE/BANDAGES/DRESSINGS)
CLOTH 2% CHLOROHEXIDINE 3PK (PERSONAL CARE ITEMS) ×3 IMPLANT
DRAPE MICROSCOPE LEICA (MISCELLANEOUS) ×3 IMPLANT
DRAPE SHEET LG 3/4 BI-LAMINATE (DRAPES) IMPLANT
DRAPE SURG 17X11 SM STRL (DRAPES) ×3 IMPLANT
DRAPE UTILITY XL STRL (DRAPES) ×3 IMPLANT
DRSG AQUACEL AG ADV 3.5X 4 (GAUZE/BANDAGES/DRESSINGS) ×3 IMPLANT
DRSG AQUACEL AG ADV 3.5X 6 (GAUZE/BANDAGES/DRESSINGS) IMPLANT
DURAPREP 26ML APPLICATOR (WOUND CARE) ×3 IMPLANT
DURASEAL SPINE SEALANT 3ML (MISCELLANEOUS) IMPLANT
ELECT BLADE TIP CTD 4 INCH (ELECTRODE) ×3 IMPLANT
ELECT REM PT RETURN 9FT ADLT (ELECTROSURGICAL) ×3
ELECTRODE REM PT RTRN 9FT ADLT (ELECTROSURGICAL) ×1 IMPLANT
GLOVE BIOGEL PI IND STRL 7.0 (GLOVE) ×1 IMPLANT
GLOVE BIOGEL PI INDICATOR 7.0 (GLOVE) ×2
GLOVE SURG SS PI 7.0 STRL IVOR (GLOVE) ×3 IMPLANT
GLOVE SURG SS PI 7.5 STRL IVOR (GLOVE) ×3 IMPLANT
GLOVE SURG SS PI 8.0 STRL IVOR (GLOVE) ×6 IMPLANT
GOWN STRL REUS W/TWL XL LVL3 (GOWN DISPOSABLE) ×6 IMPLANT
HEMOSTAT SPONGE AVITENE ULTRA (HEMOSTASIS) IMPLANT
IV CATH 14GX2 1/4 (CATHETERS) IMPLANT
KIT BASIN OR (CUSTOM PROCEDURE TRAY) ×3 IMPLANT
KIT POSITIONING SURG ANDREWS (MISCELLANEOUS) ×3 IMPLANT
MANIFOLD NEPTUNE II (INSTRUMENTS) ×3 IMPLANT
NEEDLE SPNL 18GX3.5 QUINCKE PK (NEEDLE) ×6 IMPLANT
PACK LAMINECTOMY ORTHO (CUSTOM PROCEDURE TRAY) ×3 IMPLANT
PATTIES SURGICAL .5 X.5 (GAUZE/BANDAGES/DRESSINGS) IMPLANT
PATTIES SURGICAL .75X.75 (GAUZE/BANDAGES/DRESSINGS) ×3 IMPLANT
RUBBERBAND STERILE (MISCELLANEOUS) ×6 IMPLANT
SPONGE SURGIFOAM ABS GEL 100 (HEMOSTASIS) ×3 IMPLANT
STAPLER VISISTAT (STAPLE) ×3 IMPLANT
STRIP CLOSURE SKIN 1/2X4 (GAUZE/BANDAGES/DRESSINGS) IMPLANT
SUT NURALON 4 0 TR CR/8 (SUTURE) IMPLANT
SUT PROLENE 3 0 PS 2 (SUTURE) IMPLANT
SUT VIC AB 1 CT1 27 (SUTURE)
SUT VIC AB 1 CT1 27XBRD ANTBC (SUTURE) IMPLANT
SUT VIC AB 1-0 CT2 27 (SUTURE) ×3 IMPLANT
SUT VIC AB 2-0 CT1 27 (SUTURE)
SUT VIC AB 2-0 CT1 TAPERPNT 27 (SUTURE) IMPLANT
SUT VIC AB 2-0 CT2 27 (SUTURE) IMPLANT
SYR 3ML LL SCALE MARK (SYRINGE) IMPLANT
TOWEL OR 17X26 10 PK STRL BLUE (TOWEL DISPOSABLE) ×3 IMPLANT
YANKAUER SUCT BULB TIP NO VENT (SUCTIONS) ×3 IMPLANT

## 2016-05-26 NOTE — Progress Notes (Signed)
D; asked pt if she need go to bathroom, denied , does not feel go to BR. Low abd soft. No c/o voiced.

## 2016-05-26 NOTE — Transfer of Care (Signed)
Immediate Anesthesia Transfer of Care Note  Patient: Stacey Myers  Procedure(s) Performed: Procedure(s): MICRO LUMBAR DECOMPRESSION L5 - S1 ON THE RIGHT (Right)  Patient Location: PACU  Anesthesia Type:General  Level of Consciousness:  sedated, patient cooperative and responds to stimulation  Airway & Oxygen Therapy:Patient Spontanous Breathing and Patient connected to face mask oxgen  Post-op Assessment:  Report given to PACU RN and Post -op Vital signs reviewed and stable  Post vital signs:  Reviewed and stable  Last Vitals:  Filed Vitals:   05/26/16 0824  BP: 116/74  Pulse: 82  Temp: 36.6 C  Resp: 18    Complications: No apparent anesthesia complications

## 2016-05-26 NOTE — H&P (Signed)
Stacey Myers is an 40 y.o. female.   Chief Complaint: Right lower extremity pain, numbness and weakness. HPI: The patient is a 40 year old female who presents with back pain. The patient is here today in referral from Dr. Ethelene Hal. The patient reports low back symptoms including pain and numbness (right leg and foot) which began 1 month(s) (1/2) ago without any known injury. Symptoms are reported to be located in the right low back. The pain radiates to the right buttock, right posterior thigh and right lower leg. The patient describes the pain as sharp and burning. The patient describes the severity of their symptoms as mild to severe. Symptoms are exacerbated by standing (prolonged), sitting (prolonged) and recumbency. Current treatment includes activity modification, physical therapy and heating pad. Prior to being seen today the patient was previously evaluated by Dr. Edmon Crape at [facility] and Dr. Ethelene Hal. Past evaluation has included MRI of the lumbar spine. Past treatment has included muscle relaxants and corticosteroids (X 3). Note for "Back pain": The patient is taking Gabapentin 600mg  at night.  This is a pleasant 40 year old female who presents with her mother, and kindly referred by Dr. Ethelene Hal. She has reported acute onset of right lower extremity radicular pain, numbness and weakness on the top and lateral aspect of her foot. She was seen by Mr. Allen Derry and placed on a steroid. Was noted to have neural tension signs and weakness. She was noted to have neural tension signs. The patient reports about a month of weakness in the legs and numbness. She underwent an MRI which indicates a large disc herniation at L5-S1 compressing the L5 root. She has reported some weakness in her ankle and foot as well.  Past Medical History  Diagnosis Date  . Pulmonary embolism (HCC)   . Asthma     rarely use - rescue inhaler  . Anxiety     no meds currently  . Depression     no meds currently  . Deep vein blood clot of  left lower extremity (HCC) 02/2011    dvt and pulmonary embolism  . History of kidney stones   . Headache(784.0)     migraine    Past Surgical History  Procedure Laterality Date  . Wisdom tooth extraction    . External fixature right arm    . Right wrist surgery    . Right knee surgery    . Cesarean section      x 1  . Lap band surgery    . Dilitation & currettage/hystroscopy with novasure ablation N/A 05/04/2013    Procedure: DILATATION & CURETTAGE/HYSTEROSCOPY WITH NOVASURE ABLATION;  Surgeon: Genia Del, MD;  Location: [facility];  Service: Gynecology;  Laterality: N/A;  . Laparoscopic tubal ligation Bilateral 05/04/2013    Procedure: LAPAROSCOPIC TUBAL LIGATION;  Surgeon: Genia Del, MD;  Location: [facility];  Service: Gynecology;  Laterality: Bilateral;    History reviewed. No pertinent family history. Social History:  reports that she quit smoking about 23 years ago. Her smoking use included Cigarettes. She has a .1 pack-year smoking history. She has never used smokeless tobacco. She reports that she drinks alcohol. She reports that she does not use illicit drugs.  Allergies:  Allergies  Allergen Reactions  . Azithromycin Nausea And Vomiting  . Erythromycin Nausea And Vomiting  . Penicillins Rash and Other (See Comments)    Has patient had a PCN reaction causing immediate rash, facial/tongue/throat swelling, SOB or lightheadedness with hypotension: yes Has patient had a PCN reaction  causing severe rash involving mucus membranes or skin necrosis: no Has patient had a PCN reaction that required hospitalization no Has patient had a PCN reaction occurring within the last 10 years: no If all of the above answers are "NO", then may proceed with Cephalosporin use.   . Sulfamethoxazole-Trimethoprim Rash    Medications Prior to Admission  Medication Sig Dispense Refill  . aspirin EC 81 MG tablet Take 81 mg by mouth daily.    Marland Kitchen gabapentin (NEURONTIN) 300 MG capsule Take 600  mg by mouth at bedtime.  2  . Multiple Vitamins-Minerals (ADULT GUMMY) CHEW Chew 2 tablets by mouth daily.    Marland Kitchen OVER THE COUNTER MEDICATION Take 1 tablet by mouth daily. Calcium Gummies      No results found for this or any previous visit (from the past 48 hour(s)). No results found.  Review of Systems  Constitutional: Negative.   HENT: Negative.   Eyes: Negative.   Respiratory: Negative.   Cardiovascular: Negative.   Gastrointestinal: Negative.   Genitourinary: Negative.   Musculoskeletal: Positive for back pain.  Skin: Negative.   Neurological: Positive for sensory change and focal weakness.    Blood pressure 116/74, pulse 82, temperature 97.8 F (36.6 C), temperature source Oral, resp. rate 18, height  (1.676 m), weight 124.286 kg (274 lb), SpO2 97 %. Physical Exam  Constitutional: She is oriented to person, place, and time. She appears well-developed. She appears distressed.  HENT:  Head: Normocephalic.  Eyes: Pupils are equal, round, and reactive to light.  Neck: Normal range of motion.  Cardiovascular: Normal rate.   Respiratory: Effort normal.  GI: Soft.  Musculoskeletal:  On exam, healthy female, moderate distress, walks with an antalgic gait. Straight leg raise, buttock, thigh, and calf pain; negative on the left. Her dorsiflexion and EHL of the foot is 3+/5 to 4/5. Decreased Achilles reflex on the right. Decreased sensation in the L5-S1 dermatome. She does have weakness in eversion, as well. Lumbar spine exam reveals no evidence of soft tissue swelling, deformity, or skin ecchymosis. On palpation there is no tenderness of the lumbar spine. No flank pain with percussion. The abdomen is soft and nontender. Nontender over the trochanters. No cellulitis or lymphadenopathy.  Motor is 5/5, including tibialis anterior, plantar flexion, quadriceps, and hamstrings. Patient is normoreflexic. There is no Babinski or clonus. Sensory exam is intact to light touch. Patient has  good distal pulses. No DVT. No pain and normal range of motion without instability of the hips, knees, and ankles.  Neurological: She is alert and oriented to person, place, and time.    Three-view radiographs of the lumbar spine demonstrate a mild thoracolumbar scoliosis, disc space narrowing at L4-5 and at L5-S1. No instability in flexion and extension. MRI of the lumbar spine demonstrates a large disc herniation paracentral to the right foraminal at L5-S1 compressing the L5 nerve root, as well as effacement of the S1 nerve root.  Assessment/Plan Subacute L5-S1 radiculopathy, myotomal weakness, dermatomal dysesthesias secondary to an extruded fragment at L5-S1 into the right neural foramen compressing the L5 root and displacing the S1 nerve root.  I had an extensive discussion with Ms. Knudsen and her daughter concerning her current pathology, relevant anatomy, and treatment options. Given the presence of a neurologic deficit in the presence of a large neurocompressive lesion, we want to consider decompression at L5-S1 and removal of the extruded fragment. I had an extensive discussion of the risks and benefits of the lumbar decompression with the patient  including bleeding, infection, damage to neurovascular structures, epidural fibrosis, CSF leak requiring repair. We also discussed increase in pain, adjacent segment disease, recurrent disc herniation, need for future surgery including repeat decompression and/or fusion. We also discussed risks of postoperative hematoma, paralysis, anesthetic complications including DVT, PE, death, cardiopulmonary dysfunction. In addition, the perioperative and postoperative courses were discussed in detail including the rehabilitative time and return to functional activity and work. I provided the patient with an illustrated handout and utilized the appropriate surgical models. She has a mild scoliosis. She does have an elevated BMI. We discussed this in detail,  indicating that once the compression is relieved on the L5 and S1 nerve roots, then that would allow recovery of the function of the nerve and the numbness and weakness in the lower extremity, although there is certainly a possibility that it could be permanent. We did discuss that. Due to some ankle instability, I placed her in ASO ankle support. Continue with analgesics, gabapentin. She is taking 300; she can take 2 to 3 of those at a time. She has not had a history of MRSA in the past. She has had a history of a DVT and PE, was worked up by a hematologist. It was secondary birth control pills that she is no longer taking. It was on the left side; it was saddle embolus. She takes aspirin only. There is no evidence of DVT on today's exam either.  She is allergic to PENICILLIN, but it is a just rash. We can utilize cephalosporin. Her father has had a disc herniation, so there are some genetics associated with this. Perioperatively, we discussed PAS stockings. She is taking an 81 mg aspirin, which we would stop a few days before and then resume postoperatively. Use PAS stockings and early ambulation. We will proceed as soon as possible following the clearance from her medical physician. We again, prefer to have her off the aspirin for a few days before.  Plan microlumbar decompression L5-S1 right  Kashden Deboy C, MD 05/26/2016, 9:05 AM

## 2016-05-26 NOTE — Anesthesia Preprocedure Evaluation (Addendum)
Anesthesia Evaluation  Patient identified by MRN, date of birth, ID band Patient awake    Reviewed: Allergy & Precautions, H&P , Patient's Chart, lab work & pertinent test results, reviewed documented beta blocker date and time   Airway Mallampati: II  TM Distance: >3 FB Neck ROM: full    Dental no notable dental hx.    Pulmonary former smoker,    Pulmonary exam normal breath sounds clear to auscultation       Cardiovascular  Rhythm:regular Rate:Normal     Neuro/Psych    GI/Hepatic   Endo/Other    Renal/GU      Musculoskeletal   Abdominal   Peds  Hematology   Anesthesia Other Findings No anticoagulants  Reproductive/Obstetrics                            Anesthesia Physical Anesthesia Plan  ASA: III  Anesthesia Plan: General   Post-op Pain Management:    Induction: Intravenous  Airway Management Planned: Oral ETT  Additional Equipment:   Intra-op Plan:   Post-operative Plan: Extubation in OR  Informed Consent: I have reviewed the patients History and Physical, chart, labs and discussed the procedure including the risks, benefits and alternatives for the proposed anesthesia with the patient or authorized representative who has indicated his/her understanding and acceptance.   Dental Advisory Given and Dental advisory given  Plan Discussed with: CRNA and Surgeon  Anesthesia Plan Comments: (  Discussed general anesthesia, including possible nausea, instrumentation of airway, sore throat,pulmonary aspiration, etc. I asked if the were any outstanding questions, or  concerns before we proceeded. )        Anesthesia Quick Evaluation

## 2016-05-26 NOTE — Brief Op Note (Signed)
05/26/2016  11:35 AM  PATIENT:  Stacey Myers  40 y.o. female  PRE-OPERATIVE DIAGNOSIS:  HNP L5 - S1  POST-OPERATIVE DIAGNOSIS:  HNP L5 - S1  PROCEDURE:  Procedure(s): MICRO LUMBAR DECOMPRESSION L5 - S1 ON THE RIGHT (Right)  SURGEON:  Surgeon(s) and Role:    * Jene EveryJeffrey Gokul Waybright, MD - Primary  PHYSICIAN ASSISTANT:   ASSISTANTS: Bissell   ANESTHESIA:   general  EBL:  Total I/O In: 1000 [I.V.:1000] Out: 25 [Blood:25]  BLOOD ADMINISTERED:none  DRAINS: none   LOCAL MEDICATIONS USED:  MARCAINE     SPECIMEN:  Source of Specimen:  L5S1  DISPOSITION OF SPECIMEN:  PATHOLOGY  COUNTS:  YES  TOURNIQUET:  * No tourniquets in log *  DICTATION: .Other Dictation: Dictation Number [id number]  PLAN OF CARE: Admit for overnight observation  PATIENT DISPOSITION:  PACU - hemodynamically stable.   Delay start of Pharmacological VTE agent (>24hrs) due to surgical blood loss or risk of bleeding: yes

## 2016-05-26 NOTE — Anesthesia Procedure Notes (Signed)
Procedure Name: Intubation Date/Time: 05/26/2016 9:58 AM Performed by: Wynonia SoursWALKER, Brittain Smithey L Pre-anesthesia Checklist: Patient identified, Emergency Drugs available, Suction available, Patient being monitored and Timeout performed Patient Re-evaluated:Patient Re-evaluated prior to inductionOxygen Delivery Method: Circle system utilized Preoxygenation: Pre-oxygenation with 100% oxygen Intubation Type: IV induction Ventilation: Mask ventilation without difficulty Laryngoscope Size: Mac and 4 Grade View: Grade I Tube type: Oral Tube size: 7.5 mm Number of attempts: 1 Airway Equipment and Method: Stylet Placement Confirmation: ETT inserted through vocal cords under direct vision,  positive ETCO2,  CO2 detector and breath sounds checked- equal and bilateral Secured at: 21 cm Tube secured with: Tape Dental Injury: Teeth and Oropharynx as per pre-operative assessment

## 2016-05-26 NOTE — Discharge Instructions (Signed)

## 2016-05-26 NOTE — Anesthesia Postprocedure Evaluation (Signed)
Anesthesia Post Note  Patient: Stacey Myers  Procedure(s) Performed: Procedure(s) (LRB): MICRO LUMBAR DECOMPRESSION L5 - S1 ON THE RIGHT (Right)  Patient location during evaluation: PACU Anesthesia Type: General Level of consciousness: sedated Pain management: satisfactory to patient Vital Signs Assessment: post-procedure vital signs reviewed and stable Respiratory status: spontaneous breathing Cardiovascular status: stable Anesthetic complications: no    Last Vitals:  Filed Vitals:   05/26/16 1244 05/26/16 1257  BP: 141/90 120/75  Pulse: 86 74  Temp:  36.3 C  Resp: 16 15    Last Pain:  Filed Vitals:   05/26/16 1259  PainSc: 8                  Ojani Berenson EDWARD

## 2016-05-27 ENCOUNTER — Encounter (HOSPITAL_COMMUNITY): Payer: Self-pay | Admitting: Specialist

## 2016-05-27 ENCOUNTER — Ambulatory Visit: Payer: BC Managed Care – PPO | Admitting: Physical Therapy

## 2016-05-27 DIAGNOSIS — M5126 Other intervertebral disc displacement, lumbar region: Secondary | ICD-10-CM | POA: Diagnosis not present

## 2016-05-27 MED ORDER — ASPIRIN EC 81 MG PO TBEC: 81.0000 mg | DELAYED_RELEASE_TABLET | Freq: Every day | ORAL | Status: AC

## 2016-05-27 NOTE — Care Management Note (Signed)
Case Management Note  Patient Details  Name: Rolland Portermy Dolin MRN: [id number] Date of Birth: [birth year]  Subjective/Objective:    s/p L5-S1 micro-lumbar decompression and microdiskectomy                Action/Plan: Discharge planning, no HH needs identified  Expected Discharge Date:                  Expected Discharge Plan:  Home/Self Care  In-House Referral:  NA  Discharge planning Services  CM Consult  Post Acute Care Choice:  NA Choice offered to:  NA  DME Arranged:  N/A DME Agency:  NA  HH Arranged:  NA HH Agency:  NA  Status of Service:  Completed, signed off  Medicare Important Message Given:    Date Medicare IM Given:    Medicare IM give by:    Date Additional Medicare IM Given:    Additional Medicare Important Message give by:     If discussed at Long Length of Stay Meetings, dates discussed:    Additional Comments:  Alexis Goodelleele, Ramesha Poster K, RN 05/27/2016, 11:27 AM [phone]

## 2016-05-27 NOTE — Progress Notes (Signed)
Subjective: 1 Day Post-Op Procedure(s) (LRB): MICRO LUMBAR DECOMPRESSION L5 - S1 ON THE RIGHT (Right) Patient reports pain as mild. Reports some mild back pain, well controlled. Not currently having leg pain, still noting some intermittent tingling over the dorsal R foot, improved from before surgery. She has been OOB with PT and did well with that. She is voiding without difficulty and feels ready for D/C home today.   Objective: Vital signs in last 24 hours: Temp:  [97.4 F (36.3 C)-98.4 F (36.9 C)] 97.8 F (36.6 C) (06/01 0922) Pulse Rate:  [72-99] 83 (06/01 0922) Resp:  [11-17] 16 (06/01 0922) BP: (95-154)/(55-106) 100/69 mmHg (06/01 0922) SpO2:  [94 %-100 %] 99 % (06/01 0922)  Intake/Output from previous day: 05/31 0701 - 06/01 0700 In: 2110.8 [I.V.:2010.8; IV Piggyback:100] Out: 1775 [Urine:1750; Blood:25] Intake/Output this shift: Total I/O In: 240 [P.O.:240] Out: -   No results for input(s): HGB in the last 72 hours. No results for input(s): WBC, RBC, HCT, PLT in the last 72 hours. No results for input(s): NA, K, CL, CO2, BUN, CREATININE, GLUCOSE, CALCIUM in the last 72 hours. No results for input(s): LABPT, INR in the last 72 hours.  Neurologically intact ABD soft Neurovascular intact Sensation intact distally Intact pulses distally Dorsiflexion/Plantar flexion intact Incision: dressing C/D/I and no drainage No cellulitis present Compartment soft no calf pain or sign of DVT  Assessment/Plan: 1 Day Post-Op Procedure(s) (LRB): MICRO LUMBAR DECOMPRESSION L5 - S1 ON THE RIGHT (Right) Advance diet Up with therapy D/C IV fluids  Aquacel changed- new one applied with hypofix tape top and bottom to avoid rolling  Discussed D/C instructions, Lspine precautions, dressing instructions D/C home today Follow up 10-14 days post-op for staple removal  BISSELL, JACLYN M. 05/27/2016, 10:46 AM

## 2016-05-27 NOTE — Evaluation (Signed)
  Occupational Therapy Evaluation Patient Details Name: Stacey Myers MRN: [id number] DOB: 21-Jul-1976 Today's Date: 05/27/2016    History of Present Illness Pt s/p back surgery per Dr Shelle IronBeane   Clinical Impression   OT education complete s/p back surgery.  No DME needs. Pt may purchase a reacher    Follow Up Recommendations  No OT follow up    Equipment Recommendations  None recommended by OT           Mobility Bed Mobility               General bed mobility comments: pt in chair  Transfers Overall transfer level: Needs assistance   Transfers: Sit to/from Stand Sit to Stand: Supervision                   ADL Overall ADL's : Needs assistance/impaired                     Lower Body Dressing: Minimal assistance;Sit to/from stand;Cueing for sequencing;Cueing for safety;Adhering to back precautions   Toilet Transfer: Supervision/safety;RW;Comfort height toilet;Ambulation   Toileting- Clothing Manipulation and Hygiene: Min guard;Sit to/from stand;Cueing for sequencing;Cueing for safety;Cueing for back precautions   Tub/ Shower Transfer: Adhering to back precautions     General ADL Comments: parent and son will A as needed               Pertinent Vitals/Pain Pain Assessment: No/denies pain     Hand Dominance     Extremity/Trunk Assessment Upper Extremity Assessment Upper Extremity Assessment: Overall WFL for tasks assessed           Communication Communication Communication: No difficulties   Cognition Arousal/Alertness: Awake/alert Behavior During Therapy: WFL for tasks assessed/performed Overall Cognitive Status: Within Functional Limits for tasks assessed                                Home Living Family/patient expects to be discharged to:: Private residence Living Arrangements: Children;Parent   Type of Home: House             Bathroom Shower/Tub: Chief Strategy OfficerTub/shower unit   Bathroom Toilet: Standard                 Prior Functioning/Environment Level of Independence: Independent                      OT Goals(Current goals can be found in the care plan section) Acute Rehab OT Goals Patient Stated Goal: get home and less pain OT Goal Formulation: With patient Potential to Achieve Goals: Good  OT Frequency:      End of Session Nurse Communication: Mobility status  Activity Tolerance: Patient tolerated treatment well;No increased pain Patient left: in chair;with call bell/phone within reach   Time: 0945-1010 OT Time Calculation (min): 25 min Charges:  OT General Charges $OT Visit: 1 Procedure OT Evaluation $OT Eval Low Complexity: 1 Procedure OT Treatments $Self Care/Home Management : 8-22 mins G-Codes: OT G-codes **NOT FOR INPATIENT CLASS** Functional Assessment Tool Used: clinical observation Functional Limitation: Self care Self Care Current Status (N8295(G8987): At least 1 percent but less than 20 percent impaired, limited or restricted Self Care Goal Status (A2130(G8988): 0 percent impaired, limited or restricted Self Care Discharge Status [id number]([id number]): At least 1 percent but less than 20 percent impaired, limited or restricted  Ajay Strubel, Metro KungLorraine D 05/27/2016, 10:23 AM

## 2016-05-27 NOTE — Evaluation (Signed)
Physical Therapy Evaluation Patient Details Name: Stacey Myers MRN: [id number] DOB: 04-18-1976 Today's Date: 05/27/2016   History of Present Illness  Pt s/p L5-S1 micro-lumbar decompression and microdiskectomy.    Clinical Impression  Pt s/p back surgery presents with functional mobility limitations 2* post op pain and back precautions.  Pt mobilizing this date at supervision level and with good understanding of back precautions.  Pt plans dc home this date with family assist.    Follow Up Recommendations No PT follow up    Equipment Recommendations  None recommended by PT    Recommendations for Other Services OT consult     Precautions / Restrictions Precautions Precautions: Fall;Back Precaution Booklet Issued: Yes (comment) Precaution Comments: Reviewed back precautions x 2 Restrictions Weight Bearing Restrictions: No      Mobility  Bed Mobility Overal bed mobility: Needs Assistance Bed Mobility: Rolling;Sidelying to Sit;Supine to Sit;Sit to Supine;Sit to Sidelying Rolling: Supervision Sidelying to sit: Supervision Supine to sit: Supervision Sit to supine: Supervision Sit to sidelying: Supervision General bed mobility comments: cues for sequence and correct log roll technique  Transfers Overall transfer level: Needs assistance Equipment used: None Transfers: Sit to/from Stand Sit to Stand: Supervision         General transfer comment: cues for transition position, adherence to back precautions and use of UEs to self assist  Ambulation/Gait Ambulation/Gait assistance: Min guard;Supervision Ambulation Distance (Feet): 450 Feet Assistive device: Rolling walker (2 wheeled);None Gait Pattern/deviations: Step-through pattern;Shuffle;Wide base of support     General Gait Details: cues for pacing and posture,  225 with RW and additional 225 sans assistive device - no loss of balance noted  Stairs Stairs:  (Reviewed verbally)          Wheelchair Mobility     Modified Rankin (Stroke Patients Only)       Balance                                             Pertinent Vitals/Pain Pain Assessment: 0-10 Pain Score: 3  Pain Location: back Pain Descriptors / Indicators: Aching;Sore Pain Intervention(s): Limited activity within patient's tolerance;Monitored during session;Premedicated before session    Home Living Family/patient expects to be discharged to:: Private residence Living Arrangements: Children;Parent Available Help at Discharge: Family Type of Home: House Home Access: Stairs to enter   Secretary/administratorntrance Stairs-Number of Steps: 1+1 Home Layout: Two level Home Equipment: None      Prior Function Level of Independence: Independent               Hand Dominance        Extremity/Trunk Assessment   Upper Extremity Assessment: Overall WFL for tasks assessed           Lower Extremity Assessment: Overall WFL for tasks assessed      Cervical / Trunk Assessment: Normal  Communication   Communication: No difficulties  Cognition Arousal/Alertness: Awake/alert Behavior During Therapy: WFL for tasks assessed/performed Overall Cognitive Status: Within Functional Limits for tasks assessed                      General Comments      Exercises        Assessment/Plan    PT Assessment Patient needs continued PT services  PT Diagnosis Difficulty walking   PT Problem List Decreased activity tolerance;Decreased mobility;Pain;Decreased knowledge of precautions;Decreased strength;Decreased range of  motion;Decreased knowledge of use of DME  PT Treatment Interventions DME instruction;Gait training;Functional mobility training;Stair training;Therapeutic activities;Therapeutic exercise;Patient/family education   PT Goals (Current goals can be found in the Care Plan section) Acute Rehab PT Goals Patient Stated Goal: get home and less pain PT Goal Formulation: All assessment and education complete, DC  therapy    Frequency 7X/week   Barriers to discharge        Co-evaluation               End of Session   Activity Tolerance: Patient tolerated treatment well Patient left: in chair;with call bell/phone within reach Nurse Communication: Mobility status    Functional Assessment Tool Used: clinical judgement Functional Limitation: Mobility: Walking and moving around Mobility: Walking and Moving Around Current Status (Z6109): At least 1 percent but less than 20 percent impaired, limited or restricted Mobility: Walking and Moving Around Goal Status [phone]): At least 1 percent but less than 20 percent impaired, limited or restricted Mobility: Walking and Moving Around Discharge Status [phone]): At least 1 percent but less than 20 percent impaired, limited or restricted    Time: 0902-0925 PT Time Calculation (min) (ACUTE ONLY): 23 min   Charges:   PT Evaluation $PT Eval Low Complexity: 1 Procedure     PT G Codes:   PT G-Codes **NOT FOR INPATIENT CLASS** Functional Assessment Tool Used: clinical judgement Functional Limitation: Mobility: Walking and moving around Mobility: Walking and Moving Around Current Status (B1478): At least 1 percent but less than 20 percent impaired, limited or restricted Mobility: Walking and Moving Around Goal Status [phone]): At least 1 percent but less than 20 percent impaired, limited or restricted Mobility: Walking and Moving Around Discharge Status [phone]): At least 1 percent but less than 20 percent impaired, limited or restricted    Deneene Tarver 05/27/2016, 11:07 AM

## 2016-05-27 NOTE — Op Note (Signed)
NAMRolland Porter:  Mccomb, Rachele                 ACCOUNT NO.:  [id number]  MEDICAL RECORD NO.:  [id number]  LOCATION:  1521                         [facility]  PHYSICIAN:  Jene EveryJeffrey Jamisyn Langer, M.D.    DATE OF BIRTH:  November 30, 1976  DATE OF PROCEDURE:  05/26/2016 DATE OF DISCHARGE:                              OPERATIVE REPORT   PREOPERATIVE DIAGNOSES:  Herniated nucleus pulposus spinal stenosis L5- S1 right, morbid obesity.  POSTOPERATIVE DIAGNOSES:  Herniated nucleus pulposus spinal stenosis L5- S1 right, morbid obesity, body mass index of 44.  PROCEDURE PERFORMED: 1. Microlumbar decompression L5-S1, right. 2. Foraminotomies L5-S1, right. 3. Microdiskectomy L5-S1, right.  ANESTHESIA:  General.  ASSISTANT:  Andrez GrimeJaclyn Bissell, PA.  Technical difficulty increased due to patient's elevated BMI.  TECHNIQUE:  Patient in supine position, after induction of adequate general anesthesia and 3 g Kefzol, placed prone on the [place] frame. All bony prominences were well padded.  Lumbar region prepped and draped in usual sterile fashion.  Two 18-gauge spinal needle was utilized to localize the 5-1 interspace.  Bony landmarks obscured by the elevated BMI.  This confirmed by radiograph, an incision was made from the spinous process 5 to S1.  Subcutaneous tissue was dissected. Electrocautery was utilized to achieve hemostasis.  McCulloch retractor was required to gain access to the dorsolumbar fascia.  It was infiltrated with 0.25% Marcaine with epinephrine.  It was divided in line with the skin incision.  We then used the extra long McCulloch retractors to provide retraction to the interlaminar space.  The operating microscope was draped and brought on the surgical field.  I detached ligamentum flavum from the cephalad edge of S1, performed a foraminotomy of S1, and detached the ligamentum flavum from the facet, gently mobilized the thecal sac and the S1 nerve root medially. Detached the ligamentum flavum  from the caudad edge of the outer aspect of the lamina L5.  Identified the thecal sac and the large extruded disk into the foramen of 5 that was creating tension from the 5 and the S1 nerve roots.  Fairly large disk herniation noted.  Unable to pass a neural probe out the foramen of 5.  Therefore, we protected the neural elements confirmed the disk space.  We then attempted to pass a neural probe above to the pedicle 5, we were unable to do so.  The neural elements were protected, I performed annulotomy and then focal rupture and extruded multiple fragments utilizing the combination of the Penfield and a Facilities managerWoodson retractor.  Multiple fragments were removed with straight and a micropituitary.  This was again further mobilized, [facility] retractor compressing downwards onto the rupture disk.  There was some degeneration of the disk and spurring of the endplates noted as well.  Performed foraminotomy of 5.  From both sides of the operating room table, mobilized the disk from subannular space to the foramen we were out to the centrally to the medial border of the pedicle.  I mobilized additional fragments from the foramen of 5 and within the disk extending out into the foramen of 5.  We did not pass a depth of 1 cm nor out past the midportion of the pedicle 5.  With the nerve hook and the [facility] retractor, we explored out into the foramen.  Any additional fragments were retrieved in the subangular interspace.  We irrigated the disk space with antibiotic irrigation producing additional fragments which were then retrieved.  Confirmatory radiograph obtained with a Penfield in the disk space.  Following the diskectomy, a neural probe passed freely up the foramen of 5 and S1.  There was 1 cm excursion of the S1 nerve root medial of the pedicle, we were able to pass the [facility] retractor above the pedicle of 5.  Again, access to the pedicle of 5 on either side of the 5 root and out into the foramen  without tension or compression.  We felt we had addressed the pathology.  No residual herniation within the axilla, the shoulder around to either foramen or beneath thecal sac.  Copiously irrigated with antibiotic irrigation.  No CSF leakage or active bleeding.  Good restoration of the thecal sac and removal of the herniated material.  Again this was challenged by the patient's elevated BMI, placing in multiple retractors for exposures.  Once we removed, we copiously irrigated the wound.  No active bleeding, closed the dorsolumbar fascia with 1 Vicryl and the subcu with multiple layers of 2-0.  Skin was closed with staples instead of Prolene due to her elevated BMI was greater tension upon the wound predicted and into the diminishing risk of dehiscence.  Sterile dressing applied.  Placed supine on the hospital bed, extubated without difficulty, and transported to the recovery room in satisfactory condition.  The patient tolerated the procedure well.  No complications.  Assistant, Andrez Grime, PA was required throughout the case for patient positioning, gentle intermittent neural traction, closure.     Jene Every, M.D.     Cordelia Pen  D:  05/26/2016  T:  05/27/2016  Job:  [id number]

## 2016-05-27 NOTE — Discharge Summary (Signed)
Physician Discharge Summary   Patient ID: Stacey Myers MRN: [id number] DOB/AGE: June 20, 1976 40 y.o.  Admit date: 05/26/2016 Discharge date: 05/27/2016  Primary Diagnosis:   HNP L5 - S1  Admission Diagnoses:  Past Medical History  Diagnosis Date  . Pulmonary embolism ([place])   . Asthma     rarely use - rescue inhaler  . Anxiety     no meds currently  . Depression     no meds currently  . Deep vein blood clot of left lower extremity ([place]) 02/2011    dvt and pulmonary embolism  . History of kidney stones   . Headache(784.0)     migraine   Discharge Diagnoses:   Principal Problem:   HNP (herniated nucleus pulposus), lumbar Active Problems:   Spinal stenosis of lumbar region  Procedure:  Procedure(s) (LRB): MICRO LUMBAR DECOMPRESSION L5 - S1 ON THE RIGHT (Right)   Consults: None  HPI:  see H&P    Laboratory Data: Hospital Outpatient Visit on 05/21/2016  Component Date Value Ref Range Status  . Sodium 05/21/2016 139  135 - 145 mmol/L Final  . Potassium 05/21/2016 4.1  3.5 - 5.1 mmol/L Final  . Chloride 05/21/2016 110  101 - 111 mmol/L Final  . CO2 05/21/2016 24  22 - 32 mmol/L Final  . Glucose, Bld 05/21/2016 98  65 - 99 mg/dL Final  . BUN 05/21/2016 8  6 - 20 mg/dL Final  . Creatinine, Ser 05/21/2016 0.74  0.44 - 1.00 mg/dL Final  . Calcium 05/21/2016 8.7* 8.9 - 10.3 mg/dL Final  . GFR calc non Af Amer 05/21/2016 >60  >60 mL/min Final  . GFR calc Af Amer 05/21/2016 >60  >60 mL/min Final   Comment: (NOTE) The eGFR has been calculated using the CKD EPI equation. This calculation has not been validated in all clinical situations. eGFR's persistently <60 mL/min signify possible Chronic Kidney Disease.   . Anion gap 05/21/2016 5  5 - 15 Final  . WBC 05/21/2016 7.2  4.0 - 10.5 K/uL Final  . RBC 05/21/2016 4.33  3.87 - 5.11 MIL/uL Final  . Hemoglobin 05/21/2016 13.9  12.0 - 15.0 g/dL Final  . HCT 05/21/2016 39.8  36.0 - 46.0 % Final  . MCV 05/21/2016 91.9  78.0 -  100.0 fL Final  . MCH 05/21/2016 32.1  26.0 - 34.0 pg Final  . MCHC 05/21/2016 34.9  30.0 - 36.0 g/dL Final  . RDW 05/21/2016 12.8  11.5 - 15.5 % Final  . Platelets 05/21/2016 223  150 - 400 K/uL Final  . ABO/RH(D) 05/21/2016 A NEG   Final  . Antibody Screen 05/21/2016 NEG   Final  . Sample Expiration 05/21/2016 05/29/2016   Final  . Extend sample reason 05/21/2016 NO TRANSFUSIONS OR PREGNANCY IN THE PAST 3 MONTHS   Final  . MRSA, PCR 05/21/2016 NEGATIVE  NEGATIVE Final  . Staphylococcus aureus 05/21/2016 NEGATIVE  NEGATIVE Final   Comment:        The Xpert SA Assay (FDA approved for NASAL specimens in patients over 57 years of age), is one component of a comprehensive surveillance program.  Test performance has been validated by [facility] for patients greater than or equal to 39 year old. It is not intended to diagnose infection nor to guide or monitor treatment.   . ABO/RH(D) 05/21/2016 A NEG   Final   No results for input(s): HGB in the last 72 hours. No results for input(s): WBC, RBC, HCT, PLT in the last 72  hours. No results for input(s): NA, K, CL, CO2, BUN, CREATININE, GLUCOSE, CALCIUM in the last 72 hours. No results for input(s): LABPT, INR in the last 72 hours.  X-Rays:Dg Lumbar Spine 2-3 Views  05/21/2016  CLINICAL DATA:  Preop for lumbar surgery EXAM: LUMBAR SPINE - 2-3 VIEW COMPARISON:  10/03/2007 FINDINGS: Three views of the lumbar spine submitted. Alignment and vertebral body heights are preserved. Mild anterior spurring upper endplate of L4 and L5 vertebral body. Mild disc space flattening at L5-S1 level. Facet degenerative changes L4 and L5 level. Partially visualized catheter from gastric banding device. IMPRESSION: Disc space flattening at L5-S1 level. Facet degenerative changes L4 and L5 level. Electronically Signed   By: Lahoma Crocker M.D.   On: 05/21/2016 12:15   Dg Spine Portable 1 View  05/26/2016  CLINICAL DATA:  Intraoperative localization at L5-S1 EXAM:  PORTABLE SPINE - 1 VIEW COMPARISON:  Film from earlier in the same day FINDINGS: Surgical retractors and instruments are now seen at the L5-S1 level within the disc space. Numbering nomenclature similar to that seen on the prior exam. IMPRESSION: Intraoperative localization at the L5-S1 disc space. Electronically Signed   By: Inez Catalina M.D.   On: 05/26/2016 11:30   Dg Spine Portable 1 View  05/26/2016  CLINICAL DATA:  Lumbar spine surgery. EXAM: PORTABLE SPINE - 1 VIEW COMPARISON:  Intraoperative radiograph earlier today at 1017 hours FINDINGS: Single intraoperative cross-table lateral radiograph of the lumbar spine. The tip of a surgical probe projects over the posterior elements at the L5-S1 disc space level. IMPRESSION: Intraoperative localization as above. Electronically Signed   By: Logan Bores M.D.   On: 05/26/2016 10:47   Dg Spine Portable 1 View  05/26/2016  CLINICAL DATA:  Laminectomy EXAM: PORTABLE SPINE - 1 VIEW COMPARISON:  May 21, 2016 FINDINGS: Cross-table lateral lumbar image labeled #1. Metallic probes are posterior to the L4 and L5 spinous processes respectively. No fracture or spondylolisthesis. Disc spaces appear unremarkable. IMPRESSION: Metallic probe tips are posterior to the L4 spinous process at L5 spinous process respectively. No fracture or spondylolisthesis. Electronically Signed   By: Lowella Grip III M.D.   On: 05/26/2016 10:34    EKG: Orders placed or performed during the hospital encounter of 12/28/14  . EKG 12-Lead  . EKG 12-Lead  . EKG     Hospital Course: Patient was [facility] to [facility] and taken to the OR and underwent the above state procedure without complications.  Patient tolerated the procedure well and was later transferred to the recovery room and then to the orthopaedic floor for postoperative care.  They were given PO and IV analgesics for pain control following their surgery.  They were given 24 hours of postoperative antibiotics.    PT was consulted postop to assist with mobility and transfers.  The patient was allowed to be WBAT with therapy and was taught back precautions. Discharge planning was consulted to help with postop disposition and equipment needs.  Patient had a good night on the evening of surgery and started to get up OOB with therapy on day one. Patient was seen in rounds and was ready to go home on day one.  They were given discharge instructions and dressing directions.  They were instructed on when to follow up in the office with Dr. Tonita Cong.   Diet: Regular diet Activity:WBAT; Lspine precautions Follow-up:in 10-14 days Disposition - Home Discharged Condition: good   Discharge Instructions    Call MD / Call 911  Complete by:  As directed   If you experience chest pain or shortness of breath, CALL 911 and be transported to the hospital emergency room.  If you develope a fever above 101 F, pus (white drainage) or increased drainage or redness at the wound, or calf pain, call your surgeon's office.     Constipation Prevention    Complete by:  As directed   Drink plenty of fluids.  Prune juice may be helpful.  You may use a stool softener, such as Colace (over the counter) 100 mg twice a day.  Use MiraLax (over the counter) for constipation as needed.     Diet - low sodium heart healthy    Complete by:  As directed      Increase activity slowly as tolerated    Complete by:  As directed             Medication List    TAKE these medications        ADULT GUMMY Chew  Chew 2 tablets by mouth daily.     aspirin EC 81 MG tablet  Take 1 tablet (81 mg total) by mouth daily. Resume 4 days post-op     docusate sodium 100 MG capsule  Commonly known as:  COLACE  Take 1 capsule (100 mg total) by mouth 2 (two) times daily as needed for mild constipation.     gabapentin 300 MG capsule  Commonly known as:  NEURONTIN  Take 600 mg by mouth at bedtime.     methocarbamol 500 MG tablet  Commonly known as:   ROBAXIN  Take 1 tablet (500 mg total) by mouth every 6 (six) hours as needed for muscle spasms.     OVER THE COUNTER MEDICATION  Take 1 tablet by mouth daily. Calcium Gummies     oxyCODONE-acetaminophen 5-325 MG tablet  Commonly known as:  PERCOCET  Take 1-2 tablets by mouth every 4 (four) hours as needed for severe pain.           Follow-up Information    Follow up with BEANE,JEFFREY C, MD In 2 weeks.   Specialty:  Orthopedic Surgery   Why:  For suture removal   Contact information:   [address] [place] [address] [phone]       Signed: Lacie Draft, PA-C Orthopaedic Surgery 05/27/2016, 10:56 AM

## 2018-09-05 ENCOUNTER — Emergency Department (HOSPITAL_COMMUNITY)
Admission: EM | Admit: 2018-09-05 | Discharge: 2018-09-06 | Disposition: A | Discharge status: Home / Self Care | Payer: BC Managed Care – PPO | Attending: Emergency Medicine | Admitting: Emergency Medicine

## 2018-09-05 ENCOUNTER — Encounter (HOSPITAL_COMMUNITY): Payer: Self-pay | Admitting: Family Medicine

## 2018-09-05 DIAGNOSIS — Z7982 Long term (current) use of aspirin: Secondary | ICD-10-CM | POA: Diagnosis not present

## 2018-09-05 DIAGNOSIS — J45909 Unspecified asthma, uncomplicated: Secondary | ICD-10-CM | POA: Insufficient documentation

## 2018-09-05 DIAGNOSIS — Z79899 Other long term (current) drug therapy: Secondary | ICD-10-CM | POA: Diagnosis not present

## 2018-09-05 DIAGNOSIS — Z87891 Personal history of nicotine dependence: Secondary | ICD-10-CM | POA: Insufficient documentation

## 2018-09-05 DIAGNOSIS — R102 Pelvic and perineal pain: Secondary | ICD-10-CM | POA: Diagnosis not present

## 2018-09-05 LAB — COMPREHENSIVE METABOLIC PANEL
ALK PHOS: 60 U/L (ref 38–126)
ALT: 18 U/L (ref 0–44)
AST: 21 U/L (ref 15–41)
Albumin: 3.6 g/dL (ref 3.5–5.0)
Anion gap: 11 (ref 5–15)
BUN: 11 mg/dL (ref 6–20)
CALCIUM: 8.8 mg/dL — AB (ref 8.9–10.3)
CO2: 22 mmol/L (ref 22–32)
Chloride: 108 mmol/L (ref 98–111)
Creatinine, Ser: 0.77 mg/dL (ref 0.44–1.00)
GFR calc Af Amer: >60 mL/min (ref >60)
GFR calc non Af Amer: >60 mL/min (ref >60)
GLUCOSE: 108 mg/dL — AB (ref 70–99)
Potassium: 3.9 mmol/L (ref 3.5–5.1)
SODIUM: 141 mmol/L (ref 135–145)
Total Bilirubin: 0.8 mg/dL (ref 0.3–1.2)
Total Protein: 6.9 g/dL (ref 6.5–8.1)

## 2018-09-05 LAB — CBC
HCT: 42 % (ref 36.0–46.0)
HEMOGLOBIN: 14.5 g/dL (ref 12.0–15.0)
MCH: 32.9 pg (ref 26.0–34.0)
MCHC: 34.5 g/dL (ref 30.0–36.0)
MCV: 95.2 fL (ref 78.0–100.0)
Platelets: 251 10*3/uL (ref 150–400)
RBC: 4.41 MIL/uL (ref 3.87–5.11)
RDW: 13.1 % (ref 11.5–15.5)
WBC: 12.1 10*3/uL — ABNORMAL HIGH (ref 4.0–10.5)

## 2018-09-05 LAB — I-STAT BETA HCG BLOOD, ED (MC, WL, AP ONLY)

## 2018-09-05 LAB — LIPASE, BLOOD: Lipase: 31 U/L (ref 11–51)

## 2018-09-05 MED ORDER — MORPHINE SULFATE (PF) 4 MG/ML IV SOLN
4.0000 mg | Freq: Once | INTRAVENOUS | Status: AC
  Administered 2018-09-06: 4 mg via INTRAVENOUS
  Filled 2018-09-05: qty 1

## 2018-09-05 MED ORDER — ONDANSETRON HCL 4 MG/2ML IJ SOLN
4.0000 mg | Freq: Once | INTRAMUSCULAR | Status: AC
  Administered 2018-09-06: 4 mg via INTRAVENOUS
  Filled 2018-09-05: qty 2

## 2018-09-05 NOTE — ED Provider Notes (Signed)
TIME SEEN: 11:42 PM  CHIEF COMPLAINT: Abdominal pain  HPI: Patient is a 42 year old female with history of PE and DVT, kidney stones, migraine headaches, PCOS who presents to the emergency department with abdominal pain that has been intermittent since Friday.  Pain is diffuse but mostly in the lower abdomen and feels like cramps.  Pain radiates into the back.  States this does not feel like her kidney stones or ovarian cyst.  Pain is progressively gotten worse today she has tried Tylenol, Mobic without relief.  No fever.  Has had nausea.  No vomiting or diarrhea.  No bloody stool or melena.  No dysuria or hematuria.  No abnormal vaginal discharge or bleeding.  She is not sexually active.  She is status post uterine ablation in 2014, C-section in 2003, BTL, gastric band surgery.  ROS: See HPI Constitutional: no fever  Eyes: no drainage  ENT: no runny nose   Cardiovascular:  no chest pain  Resp: no SOB  GI: no vomiting GU: no dysuria Integumentary: no rash  Allergy: no hives  Musculoskeletal: no leg swelling  Neurological: no slurred speech ROS otherwise negative  PAST MEDICAL HISTORY/PAST SURGICAL HISTORY:  Past Medical History:  Diagnosis Date  . Anxiety    no meds currently  . Asthma    rarely use - rescue inhaler  . Deep vein blood clot of left lower extremity (HCC) 02/2011   dvt and pulmonary embolism  . Depression    no meds currently  . Headache(784.0)    migraine  . History of kidney stones   . Pulmonary embolism (HCC)     MEDICATIONS:  Prior to Admission medications   Medication Sig Start Date End Date Taking? Authorizing Provider  aspirin EC 81 MG tablet Take 1 tablet (81 mg total) by mouth daily. Resume 4 days post-op 05/27/16  Yes Andrez Grime M, PA-C  Multiple Vitamins-Minerals (ADULT GUMMY) CHEW Chew 2 tablets by mouth daily.   Yes [provider]  OVER THE COUNTER MEDICATION Take 1 tablet by mouth daily. Calcium Gummies   Yes [provider]   docusate sodium (COLACE) 100 MG capsule Take 1 capsule (100 mg total) by mouth 2 (two) times daily as needed for mild constipation. Patient not taking: Reported on 09/05/2018 05/26/16   Jene Every, MD  methocarbamol (ROBAXIN) 500 MG tablet Take 1 tablet (500 mg total) by mouth every 6 (six) hours as needed for muscle spasms. Patient not taking: Reported on 09/05/2018 05/26/16   Jene Every, MD  oxyCODONE-acetaminophen (PERCOCET) 5-325 MG tablet Take 1-2 tablets by mouth every 4 (four) hours as needed for severe pain. Patient not taking: Reported on 09/05/2018 05/26/16   Jene Every, MD    ALLERGIES:  Allergies  Allergen Reactions  . Azithromycin Nausea And Vomiting  . Erythromycin Nausea And Vomiting  . Penicillins Rash and Other (See Comments)    Has patient had a PCN reaction causing immediate rash, facial/tongue/throat swelling, SOB or lightheadedness with hypotension: yes Has patient had a PCN reaction causing severe rash involving mucus membranes or skin necrosis: no Has patient had a PCN reaction that required hospitalization no Has patient had a PCN reaction occurring within the last 10 years: no If all of the above answers are "NO", then may proceed with Cephalosporin use.   . Sulfamethoxazole-Trimethoprim Rash    SOCIAL HISTORY:  Social History   Tobacco Use  . Smoking status: Former Smoker    Packs/day: 0.10    Years: 1.00  Pack years: 0.10    Types: Cigarettes    Last attempt to quit: 12/27/1992    Years since quitting: 25.7  . Smokeless tobacco: Never Used  . Tobacco comment: Patient is denying she smoked.   Substance Use Topics  . Alcohol use: Yes    Comment: Less than once a month.     FAMILY HISTORY: History reviewed. No pertinent family history.  EXAM: BP 122/70   Pulse 71   Temp 97.7 F (36.5 C) (Oral)   Resp 12   Ht 5\' 6"  (1.676 m)   Wt 127 kg   SpO2 99%   BMI 45.19 kg/m  CONSTITUTIONAL: Alert and oriented and responds appropriately to  questions. Well-appearing; well-nourished, obese HEAD: Normocephalic EYES: Conjunctivae clear, pupils appear equal, EOMI ENT: normal nose; moist mucous membranes NECK: Supple, no meningismus, no nuchal rigidity, no LAD  CARD: RRR; S1 and S2 appreciated; no murmurs, no clicks, no rubs, no gallops RESP: Normal chest excursion without splinting or tachypnea; breath sounds clear and equal bilaterally; no wheezes, no rhonchi, no rales, no hypoxia or respiratory distress, speaking full sentences ABD/GI: Normal bowel sounds; non-distended; soft, throughout the abdomen worse in the pelvic region, no rebound, no guarding, no peritoneal signs, no hepatosplenomegaly GU:  Normal external genitalia. No lesions, rashes noted. Patient has no vaginal bleeding on exam. No vaginal discharge.  Bilateral adnexal tenderness worse on the right but no mass or fullness, no cervical motion tenderness. Cervix is not appear friable.  Cervix is closed.  Chaperone present for exam. BACK:  The back appears normal and is non-tender to palpation, there is no CVA tenderness EXT: Normal ROM in all joints; non-tender to palpation; no edema; normal capillary refill; no cyanosis, no calf tenderness or swelling    SKIN: Normal color for age and race; warm; no rash NEURO: Moves all extremities equally PSYCH: The patient's mood and manner are appropriate. Grooming and personal hygiene are appropriate.  MEDICAL DECISION MAKING: Patient here with lower abdominal pain.  Differential includes ovarian cyst, UTI, less likely torsion or appendicitis, colitis.  Labs here are unremarkable other than slightly elevated white blood cell count of 12.1.  Negative pregnancy test.  Will perform pelvic exam with cultures, obtain urinalysis and start with a transvaginal ultrasound.  Will give pain and nausea medicine in the ED.  ED PROGRESS: Exam reveals adnexal tenderness more on the right than the left.  Will obtain transvaginal ultrasound with Doppler.   She did have mild reaction with hives to the left arm after receiving morphine.  Improved with Benadryl.  No shortness of breath, hypoxia, angioedema.  No hypertension.   Ultrasound technician was unable to identify patient's ovaries on ultrasound.  This makes torsion or large mass or cyst less likely and patient does appear more comfortable after morphine.  Given I do not have a great explanation of patient's pain, will obtain a CT of her abdomen pelvis for further evaluation.    Patient's wet prep was positive for clue cells but she has no vaginal discharge, odor, itching or burning.  I do not feel this needs be treated at this time.  CT scan shows no acute abnormality.  Recommended follow-up with her PCP and OB/GYN for pelvic pain.  No acute abnormality seen on work-up here today.  Recommended alternating Tylenol and Motrin for pain.  Will discharge with Zofran.   At this time, I do not feel there is any life-threatening condition present. I have reviewed and discussed all results (EKG,  imaging, lab, urine as appropriate) and exam findings with patient/family. I have reviewed nursing notes and appropriate previous records.  I feel the patient is safe to be discharged home without further emergent workup and can continue workup as an outpatient as needed. Discussed usual and customary return precautions. Patient/family verbalize understanding and are comfortable with this plan.  Outpatient follow-up has been provided if needed. All questions have been answered.     Gaia Gullikson, Layla Maw, DO 09/06/18 [phone]

## 2018-09-05 NOTE — ED Triage Notes (Signed)
Patient is complaining of lower abd that radiates to her back. Pain started on Friday and has been intermittent. Patient reports at 17:00 pain started back and has been intense. Associated symptoms are loss of appetite. Denies nausea, vomiting, and diarrhea. Denies fever or urinary symptoms.

## 2018-09-06 ENCOUNTER — Emergency Department (HOSPITAL_COMMUNITY): Payer: BC Managed Care – PPO

## 2018-09-06 ENCOUNTER — Encounter (HOSPITAL_COMMUNITY): Payer: Self-pay

## 2018-09-06 LAB — URINALYSIS, ROUTINE W REFLEX MICROSCOPIC
BILIRUBIN URINE: NEGATIVE
GLUCOSE, UA: NEGATIVE mg/dL
HGB URINE DIPSTICK: NEGATIVE
KETONES UR: NEGATIVE mg/dL
Leukocytes, UA: NEGATIVE
Nitrite: NEGATIVE
Protein, ur: NEGATIVE mg/dL
Specific Gravity, Urine: 1.014 (ref 1.005–1.030)
pH: 5 (ref 5.0–8.0)

## 2018-09-06 LAB — WET PREP, GENITAL
Sperm: NONE SEEN
Trich, Wet Prep: NONE SEEN
YEAST WET PREP: NONE SEEN

## 2018-09-06 LAB — GC/CHLAMYDIA PROBE AMP (~~LOC~~) NOT AT ARMC
Chlamydia: NEGATIVE
NEISSERIA GONORRHEA: NEGATIVE

## 2018-09-06 MED ORDER — HYDROCODONE-ACETAMINOPHEN 5-325 MG PO TABS: 1.0000 | ORAL_TABLET | ORAL | 0 refills | Status: AC | PRN

## 2018-09-06 MED ORDER — ONDANSETRON 4 MG PO TBDP: 4.0000 mg | ORAL_TABLET | Freq: Four times a day (QID) | ORAL | 0 refills | Status: AC | PRN

## 2018-09-06 MED ORDER — IOPAMIDOL (ISOVUE-300) INJECTION 61%
100.0000 mL | Freq: Once | INTRAVENOUS | Status: AC | PRN
  Administered 2018-09-06: 100 mL via INTRAVENOUS

## 2018-09-06 MED ORDER — IOPAMIDOL (ISOVUE-300) INJECTION 61%
INTRAVENOUS | Status: AC
  Filled 2018-09-06: qty 100

## 2018-09-06 MED ORDER — DIPHENHYDRAMINE HCL 50 MG/ML IJ SOLN
25.0000 mg | Freq: Once | INTRAMUSCULAR | Status: AC
  Administered 2018-09-06: 25 mg via INTRAVENOUS
  Filled 2018-09-06: qty 1

## 2018-09-06 NOTE — ED Notes (Signed)
US at bedside

## 2018-09-06 NOTE — Discharge Instructions (Signed)
You may alternate Tylenol 1000 mg every 6 hours as needed for pain and Ibuprofen 800 mg every 8 hours as needed for pain.  Please take Ibuprofen with food. ° ° ° ° °[facility] °[email or web address] °[address] °[place], [place] °[phone]  ° ° °[facility] °[email or web address] °[address] °[place], [place] °[phone]  ° ° °[facility] °[address] °[place], [place] °[phone]  ° °[facility] °[email or web address] °[address] °[place], [place] °[phone]  ° °[facility] °[email or web address] °[address] °[place], [place] °[phone]  ° °[facility] °[email or web address] °[address] °, [place] °[phone] ° ° °

## 2018-09-06 NOTE — ED Notes (Addendum)
Pt given morphine and immediately c/o of her left arm being itchy and hives. No shortness of breath. MD notified and at bedside

## 2019-11-21 ENCOUNTER — Other Ambulatory Visit: Payer: Self-pay | Admitting: Gynecology

## 2019-11-21 DIAGNOSIS — N6320 Unspecified lump in the left breast, unspecified quadrant: Secondary | ICD-10-CM

## 2019-12-04 ENCOUNTER — Other Ambulatory Visit: Payer: Self-pay | Admitting: Gynecology

## 2019-12-04 ENCOUNTER — Ambulatory Visit
Admission: RE | Admit: 2019-12-04 | Discharge: 2019-12-04 | Disposition: A | Discharge status: Home / Self Care | Payer: BC Managed Care – PPO | Source: Ambulatory Visit | Attending: Gynecology | Admitting: Gynecology

## 2019-12-04 ENCOUNTER — Other Ambulatory Visit: Payer: Self-pay

## 2019-12-04 DIAGNOSIS — N6320 Unspecified lump in the left breast, unspecified quadrant: Secondary | ICD-10-CM

## 2020-01-22 ENCOUNTER — Other Ambulatory Visit: Payer: Self-pay | Admitting: Gynecology

## 2020-03-04 ENCOUNTER — Encounter (HOSPITAL_BASED_OUTPATIENT_CLINIC_OR_DEPARTMENT_OTHER): Payer: Self-pay | Admitting: *Deleted

## 2020-03-04 ENCOUNTER — Other Ambulatory Visit: Payer: Self-pay

## 2020-03-04 ENCOUNTER — Emergency Department (HOSPITAL_BASED_OUTPATIENT_CLINIC_OR_DEPARTMENT_OTHER)
Admission: EM | Admit: 2020-03-04 | Discharge: 2020-03-04 | Disposition: A | Discharge status: Home / Self Care | Payer: BC Managed Care – PPO | Attending: Emergency Medicine | Admitting: Emergency Medicine

## 2020-03-04 DIAGNOSIS — Z881 Allergy status to other antibiotic agents status: Secondary | ICD-10-CM | POA: Insufficient documentation

## 2020-03-04 DIAGNOSIS — Z79899 Other long term (current) drug therapy: Secondary | ICD-10-CM | POA: Insufficient documentation

## 2020-03-04 DIAGNOSIS — Z7982 Long term (current) use of aspirin: Secondary | ICD-10-CM | POA: Insufficient documentation

## 2020-03-04 DIAGNOSIS — Z880 Allergy status to penicillin: Secondary | ICD-10-CM | POA: Insufficient documentation

## 2020-03-04 DIAGNOSIS — Z882 Allergy status to sulfonamides status: Secondary | ICD-10-CM | POA: Diagnosis not present

## 2020-03-04 DIAGNOSIS — Z86718 Personal history of other venous thrombosis and embolism: Secondary | ICD-10-CM | POA: Diagnosis not present

## 2020-03-04 DIAGNOSIS — Z87891 Personal history of nicotine dependence: Secondary | ICD-10-CM | POA: Diagnosis not present

## 2020-03-04 DIAGNOSIS — J45909 Unspecified asthma, uncomplicated: Secondary | ICD-10-CM | POA: Insufficient documentation

## 2020-03-04 DIAGNOSIS — R1031 Right lower quadrant pain: Secondary | ICD-10-CM | POA: Diagnosis present

## 2020-03-04 DIAGNOSIS — R109 Unspecified abdominal pain: Secondary | ICD-10-CM

## 2020-03-04 DIAGNOSIS — Z885 Allergy status to narcotic agent status: Secondary | ICD-10-CM | POA: Diagnosis not present

## 2020-03-04 LAB — URINALYSIS, MICROSCOPIC (REFLEX): RBC / HPF: >50 RBC/hpf (ref 0–5)

## 2020-03-04 LAB — URINALYSIS, ROUTINE W REFLEX MICROSCOPIC
Bilirubin Urine: NEGATIVE
Glucose, UA: NEGATIVE mg/dL
Ketones, ur: 15 mg/dL — AB
Leukocytes,Ua: NEGATIVE
Nitrite: NEGATIVE
Protein, ur: NEGATIVE mg/dL
Specific Gravity, Urine: >1.03 — ABNORMAL HIGH (ref 1.005–1.030)
pH: 5.5 (ref 5.0–8.0)

## 2020-03-04 MED ORDER — OXYCODONE-ACETAMINOPHEN 5-325 MG PO TABS: 1.0000 | ORAL_TABLET | Freq: Four times a day (QID) | ORAL | 0 refills | Status: AC | PRN

## 2020-03-04 MED ORDER — IBUPROFEN 600 MG PO TABS: 600.0000 mg | ORAL_TABLET | Freq: Four times a day (QID) | ORAL | 0 refills | Status: AC | PRN

## 2020-03-04 MED FILL — OXYCODONE-ACETAMINOPHEN 5-3: 5-325 | 3 days supply | Qty: 12 | Fill #0

## 2020-03-04 MED FILL — IBUPROFEN 600 MG TABLET: 600 | 7 days supply | Qty: 30 | Fill #0

## 2020-03-04 NOTE — Discharge Instructions (Signed)
You came into the ER today with right-sided flank pain began abruptly this morning.  Your urinalysis also showed blood in your urine.  This is strongly suggestive of passing a kidney stone.  We talked about whether to get a CT scan of your abdomen to look for stone, and decided not to do this today.  As explained, you do not need a CT scan to diagnose kidney stone pain.  Most kidney stones are small enough to pass on their own.  This can take an average of 4 weeks.   Your pain may come and go away during that time.  If you are still having pain after 2 weeks, you can come back to the emergency department, at which point they will likely do a renal CT scan to confirm the stone's size and location.  You can take 600 mg ibuprofen every 6 hours for the next 7 days for pain and inflammation.  I prescribed percocet 5 mg to take for break-through pain only.

## 2020-03-04 NOTE — ED Triage Notes (Signed)
Right flank pain sudden onset this am. She was seen at [facility] and was told she was having muscle spasms. She was given Toradol. Pain is coming back. Pt is ambulatory.

## 2020-03-04 NOTE — ED Provider Notes (Signed)
[facility] EMERGENCY DEPARTMENT Provider Note   CSN: [id number] Arrival date & time: 03/04/20  1251     History Chief Complaint  Patient presents with  . Flank Pain    Stacey Myers is a 44 y.o. female with a history of kidney stones, obesity, presenting to the emergency department with right-sided flank pain.  The patient reports abrupt onset of pain while she was driving to work today.  She said it was very severe maximal at onset.  It is a sharp pain that began in her right flank radiates down towards her right hip.  She has never had this kind of pain before.  She went to an urgent care and was diagnosed with back spasm and strain and given Toradol.  The Toradol did help her symptoms briefly, although the pain never went away, but her symptoms are back with intensity.  She denies dysuria or hematuria.  She is unaware of a history of kidney stone, however CT scan of the abdomen 2019 incidentally picked up likely nonobstructing stone in the right lower pole of the kidney.  She reports diminished appetite but denies nausea or vomiting.  She denies fevers or chills.  She denies any history of chronic back pain or sciatica.  HPI     Past Medical History:  Diagnosis Date  . Anxiety    no meds currently  . Asthma    rarely use - rescue inhaler  . Deep vein blood clot of left lower extremity (HCC) 02/2011   dvt and pulmonary embolism  . Depression    no meds currently  . Headache(784.0)    migraine  . History of kidney stones   . Pulmonary embolism [facility])     Patient Active Problem List   Diagnosis Date Noted  . HNP (herniated nucleus pulposus), lumbar 05/26/2016  . Spinal stenosis of lumbar region 05/26/2016  . ACUTE FRONTAL SINUSITIS 08/06/2008  . OTITIS MEDIA, SUPPURATIVE, ACUTE, LEFT 11/03/2007  . VIRAL URI 10/27/2007  . DEPRESSION 08/31/2007  . HEADACHE 08/31/2007    Past Surgical History:  Procedure Laterality Date  . CESAREAN SECTION     x 1  .  DILITATION & CURRETTAGE/HYSTROSCOPY WITH NOVASURE ABLATION N/A 05/04/2013   Procedure: DILATATION & CURETTAGE/HYSTEROSCOPY WITH NOVASURE ABLATION;  Surgeon: Genia Del, MD;  Location: [facility];  Service: Gynecology;  Laterality: N/A;  . external fixature right arm    . Lap Band Surgery    . LAPAROSCOPIC TUBAL LIGATION Bilateral 05/04/2013   Procedure: LAPAROSCOPIC TUBAL LIGATION;  Surgeon: Genia Del, MD;  Location: [facility]S;  Service: Gynecology;  Laterality: Bilateral;  . LUMBAR LAMINECTOMY/DECOMPRESSION MICRODISCECTOMY Right 05/26/2016   Procedure: MICRO LUMBAR DECOMPRESSION L5 - S1 ON THE RIGHT;  Surgeon: Jene Every, MD;  Location: [facility];  Service: Orthopedics;  Laterality: Right;  . right knee surgery    . right wrist surgery    . WISDOM TOOTH EXTRACTION       OB History   No obstetric history on file.     No family history on file.  Social History   Tobacco Use  . Smoking status: Former Smoker    Packs/day: 0.10    Years: 1.00    Pack years: 0.10    Types: Cigarettes    Quit date: 12/27/1992    Years since quitting: 27.2  . Smokeless tobacco: Never Used  . Tobacco comment: Patient is denying she smoked.   Substance Use Topics  . Alcohol use: Yes    Comment:  Less than once a month.   . Drug use: No    Home Medications Prior to Admission medications   Medication Sig Start Date End Date Taking? Authorizing Provider  aspirin EC 81 MG tablet Take 1 tablet (81 mg total) by mouth daily. Resume 4 days post-op 05/27/16  Yes Lacie Draft M, PA-C  Multiple Vitamins-Minerals (ADULT GUMMY) CHEW Chew 2 tablets by mouth daily.   Yes [provider]  docusate sodium (COLACE) 100 MG capsule Take 1 capsule (100 mg total) by mouth 2 (two) times daily as needed for mild constipation. Patient not taking: Reported on 09/05/2018 05/26/16   Susa Day, MD  HYDROcodone-acetaminophen (NORCO/VICODIN) 5-325 MG tablet Take 1 tablet by mouth every 4 (four) hours as needed.  09/06/18   Ward, Delice Bison, DO  ibuprofen (ADVIL) 600 MG tablet Take 1 tablet (600 mg total) by mouth every 6 (six) hours as needed for up to 30 doses for mild pain or moderate pain. 03/04/20   Wyvonnia Dusky, MD  methocarbamol (ROBAXIN) 500 MG tablet Take 1 tablet (500 mg total) by mouth every 6 (six) hours as needed for muscle spasms. Patient not taking: Reported on 09/05/2018 05/26/16   Susa Day, MD  ondansetron (ZOFRAN ODT) 4 MG disintegrating tablet Take 1 tablet (4 mg total) by mouth every 6 (six) hours as needed for nausea or vomiting. 09/06/18   Ward, Delice Bison, DO  OVER THE COUNTER MEDICATION Take 1 tablet by mouth daily. Calcium Gummies    [provider]  oxyCODONE-acetaminophen (PERCOCET) 5-325 MG tablet Take 1-2 tablets by mouth every 4 (four) hours as needed for severe pain. Patient not taking: Reported on 09/05/2018 05/26/16   Susa Day, MD  oxyCODONE-acetaminophen (PERCOCET/ROXICET) 5-325 MG tablet Take 1 tablet by mouth every 6 (six) hours as needed for up to 3 days for severe pain. 03/04/20 03/07/20  Wyvonnia Dusky, MD    Allergies    Morphine and related, Azithromycin, Erythromycin, Penicillins, and Sulfamethoxazole-trimethoprim  Review of Systems   Review of Systems  Constitutional: Positive for appetite change. Negative for chills and fever.  Respiratory: Negative for cough and shortness of breath.   Cardiovascular: Negative for chest pain and palpitations.  Gastrointestinal: Positive for nausea. Negative for abdominal pain and vomiting.  Genitourinary: Positive for flank pain. Negative for difficulty urinating, dysuria and hematuria.  Musculoskeletal: Positive for back pain.  Neurological: Negative for weakness and numbness.  Psychiatric/Behavioral: Negative for agitation and confusion.  All other systems reviewed and are negative.   Physical Exam Updated Vital Signs BP 117/84   Pulse 73   Temp 98.1 F (36.7 C) (Oral)   Resp 18   Ht 5\' 6"  (1.676  m)   Wt 132 kg   SpO2 100%   BMI 46.97 kg/m   Physical Exam Vitals and nursing note reviewed.  Constitutional:      General: She is not in acute distress.    Appearance: She is well-developed. She is obese.  HENT:     Head: Normocephalic and atraumatic.  Eyes:     Conjunctiva/sclera: Conjunctivae normal.  Cardiovascular:     Rate and Rhythm: Normal rate and regular rhythm.     Pulses: Normal pulses.     Comments: Moderate lower right sided paraspinal back tenderness Pulmonary:     Effort: Pulmonary effort is normal. No respiratory distress.     Breath sounds: Normal breath sounds.  Abdominal:     General: There is no distension.     Palpations:  Abdomen is soft.  Musculoskeletal:     Cervical back: Neck supple.  Skin:    General: Skin is warm and dry.  Neurological:     General: No focal deficit present.     Mental Status: She is alert and oriented to person, place, and time.  Psychiatric:        Mood and Affect: Mood normal.        Behavior: Behavior normal.     ED Results / Procedures / Treatments   Labs (all labs ordered are listed, but only abnormal results are displayed) Labs Reviewed  URINALYSIS, ROUTINE W REFLEX MICROSCOPIC - Abnormal; Notable for the following components:      Result Value   Specific Gravity, Urine >1.030 (*)    Hgb urine dipstick LARGE (*)    Ketones, ur 15 (*)    All other components within normal limits  URINALYSIS, MICROSCOPIC (REFLEX) - Abnormal; Notable for the following components:   Bacteria, UA RARE (*)    All other components within normal limits    EKG None  Radiology No results found.  Procedures Procedures (including critical care time)  Medications Ordered in ED Medications - No data to display  ED Course  I have reviewed the triage vital signs and the nursing notes.  Pertinent labs & imaging results that were available during my care of the patient were reviewed by me and considered in my medical decision  making (see chart for details).  44 yo female presenting to ED with 1 day of sudden onset right sided flank pain, while driving to work today, radiating toward her buttock.  There was no report of fall or trauma.  She was seen at [facility] and diagnosed with lumbar strain, given toradol with some relief, but now her pain is returning.  She does have some reproducible muscular tenderness in her right lower back, but she does not feel like this pain is the internal pain she is experiencing.  I suspect she is passing a kidney stone, and experiencing back spasms from the pain.  Her UA has hgb here.  No likely infection in UA sample.  We discussed conservative management options.  Most kidney stones pass spontaneously.  I did not feel she needed an urgent CT scan, and she preferred to defer on the scan at this time, to save both money and radiation exposure.  We'll do pain control, I advised her this can be a prolonged process.  If she's still in significant pain in 2 weeks, she can return to the ED, where the provider may wish to pursue a CT renal study at that time.    Final Clinical Impression(s) / ED Diagnoses Final diagnoses:  Flank pain    Rx / DC Orders ED Discharge Orders         Ordered    oxyCODONE-acetaminophen (PERCOCET/ROXICET) 5-325 MG tablet  Every 6 hours PRN     03/04/20 1354    ibuprofen (ADVIL) 600 MG tablet  Every 6 hours PRN     03/04/20 1354           Terald Sleeper, MD 03/04/20 1935

## 2020-05-12 ENCOUNTER — Ambulatory Visit
Admission: RE | Admit: 2020-05-12 | Discharge: 2020-05-12 | Disposition: A | Discharge status: Home / Self Care | Payer: BC Managed Care – PPO | Source: Ambulatory Visit | Attending: Gynecology | Admitting: Gynecology

## 2020-05-12 ENCOUNTER — Other Ambulatory Visit: Payer: Self-pay | Admitting: Gynecology

## 2020-05-12 ENCOUNTER — Other Ambulatory Visit: Payer: Self-pay

## 2020-05-12 DIAGNOSIS — N6320 Unspecified lump in the left breast, unspecified quadrant: Secondary | ICD-10-CM

## 2020-11-17 ENCOUNTER — Ambulatory Visit
Admission: RE | Admit: 2020-11-17 | Discharge: 2020-11-17 | Disposition: A | Discharge status: Home / Self Care | Payer: BC Managed Care – PPO | Source: Ambulatory Visit | Attending: Gynecology | Admitting: Gynecology

## 2020-11-17 ENCOUNTER — Other Ambulatory Visit: Payer: Self-pay

## 2020-11-17 DIAGNOSIS — N6320 Unspecified lump in the left breast, unspecified quadrant: Secondary | ICD-10-CM

## 2021-11-11 ENCOUNTER — Other Ambulatory Visit: Payer: Self-pay | Admitting: Gynecology

## 2021-11-11 DIAGNOSIS — Z09 Encounter for follow-up examination after completed treatment for conditions other than malignant neoplasm: Secondary | ICD-10-CM

## 2021-12-29 ENCOUNTER — Ambulatory Visit
Admission: RE | Admit: 2021-12-29 | Discharge: 2021-12-29 | Disposition: A | Discharge status: Home / Self Care | Payer: BC Managed Care – PPO | Source: Ambulatory Visit | Attending: Gynecology | Admitting: Gynecology

## 2021-12-29 DIAGNOSIS — Z09 Encounter for follow-up examination after completed treatment for conditions other than malignant neoplasm: Secondary | ICD-10-CM

## 2023-01-24 ENCOUNTER — Other Ambulatory Visit: Payer: Self-pay | Admitting: Gynecology

## 2023-01-24 DIAGNOSIS — Z1231 Encounter for screening mammogram for malignant neoplasm of breast: Secondary | ICD-10-CM

## 2023-01-27 ENCOUNTER — Ambulatory Visit
Admission: RE | Admit: 2023-01-27 | Discharge: 2023-01-27 | Disposition: A | Discharge status: Home / Self Care | Payer: BC Managed Care – PPO | Source: Ambulatory Visit | Attending: Gynecology | Admitting: Gynecology

## 2023-01-27 DIAGNOSIS — Z1231 Encounter for screening mammogram for malignant neoplasm of breast: Secondary | ICD-10-CM

## 2023-08-31 IMAGING — MG DIGITAL DIAGNOSTIC BILAT W/ TOMO W/ CAD
8 of 15 series · 8 of 40 positions shown · non-contrast
Comparison: Previous exam(s).

ACR Breast Density Category a: The breast tissue is almost entirely
fatty.

CLINICAL DATA: 45-year-old female presenting for annual bilateral
mammogram and final 2 year follow-up of a probably benign left
breast mass.

EXAM:
DIGITAL DIAGNOSTIC BILATERAL MAMMOGRAM WITH TOMOSYNTHESIS AND CAD;
ULTRASOUND LEFT BREAST LIMITED
TECHNIQUE: Bilateral digital diagnostic mammography and breast tomosynthesis
was performed. The images were evaluated with computer-aided
detection.; Targeted ultrasound examination of the left breast was
performed.

[R MLO synth-2D]
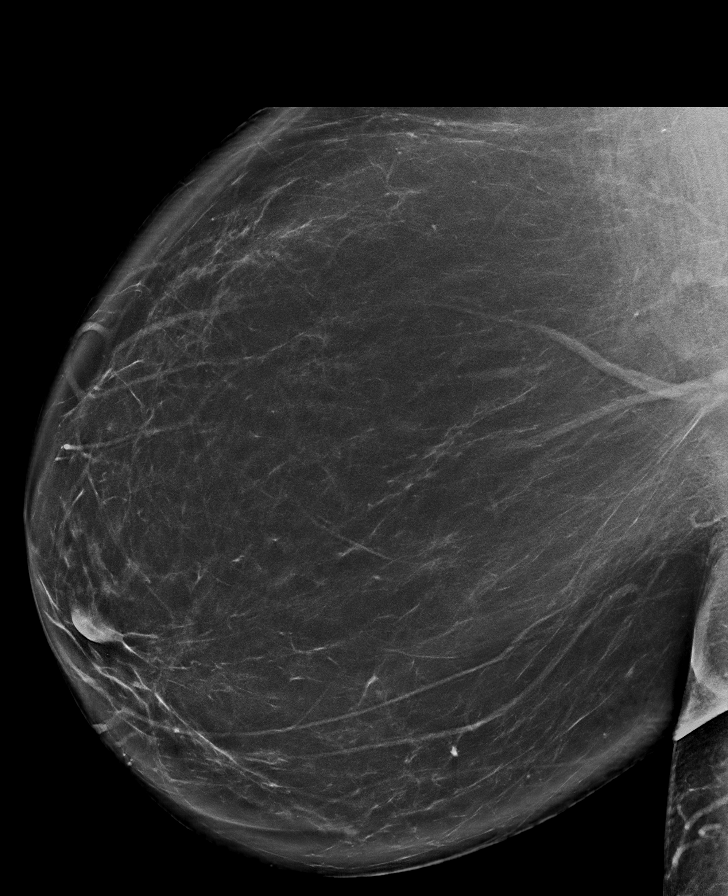

[R CC synth-2D (1 of 2)]
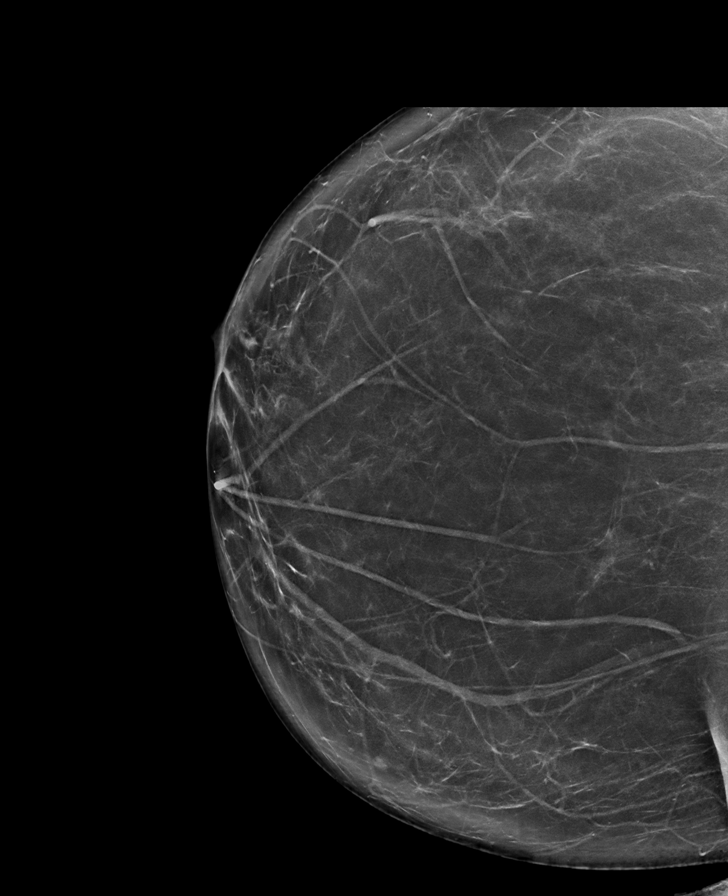

[R CC synth-2D (2 of 2)]
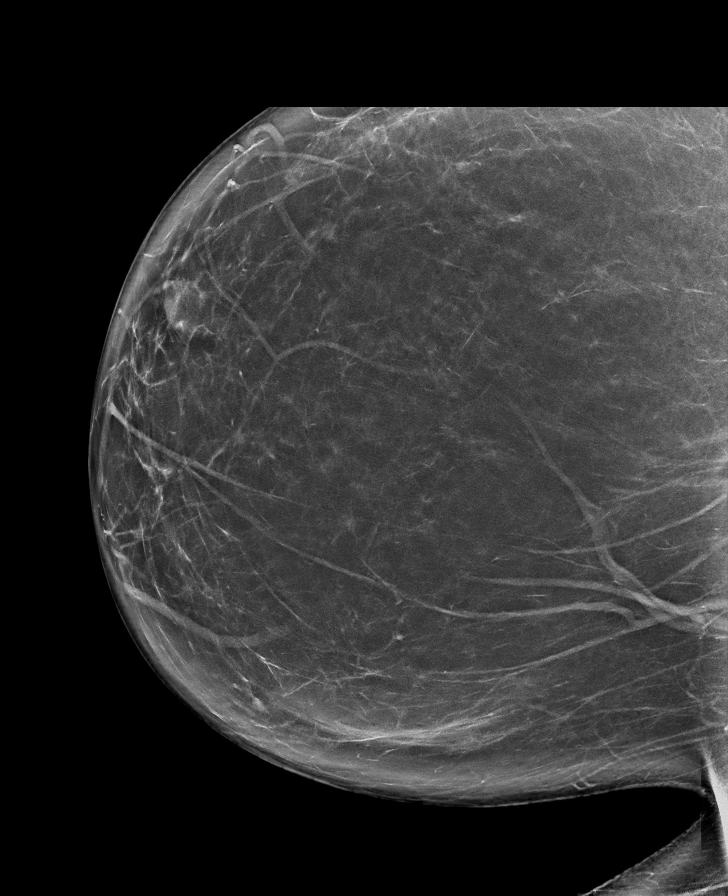

[L MLO synth-2D (1 of 2)]
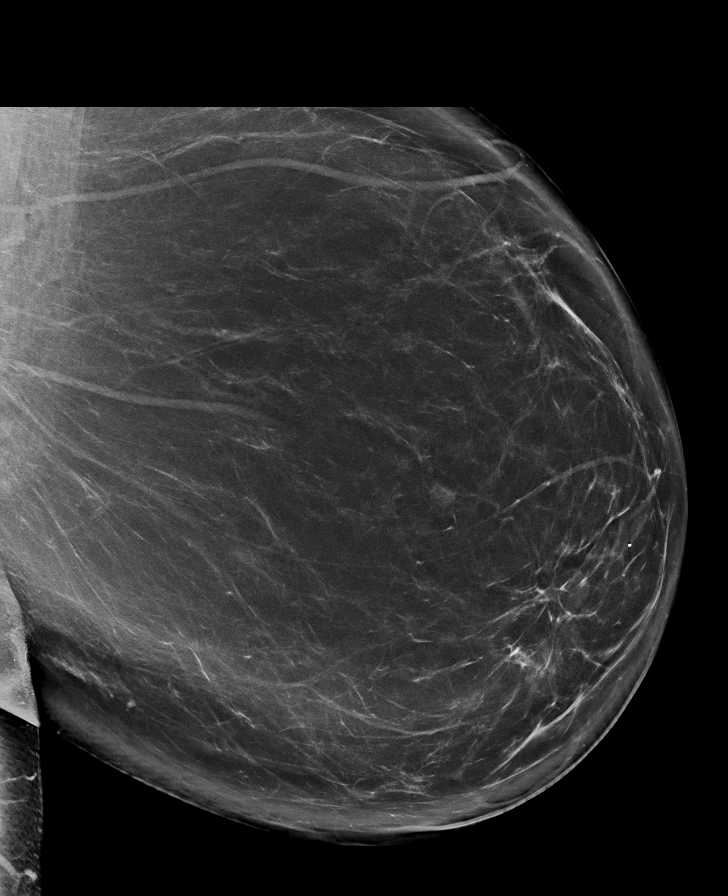

[L CC synth-2D (1 of 2)]
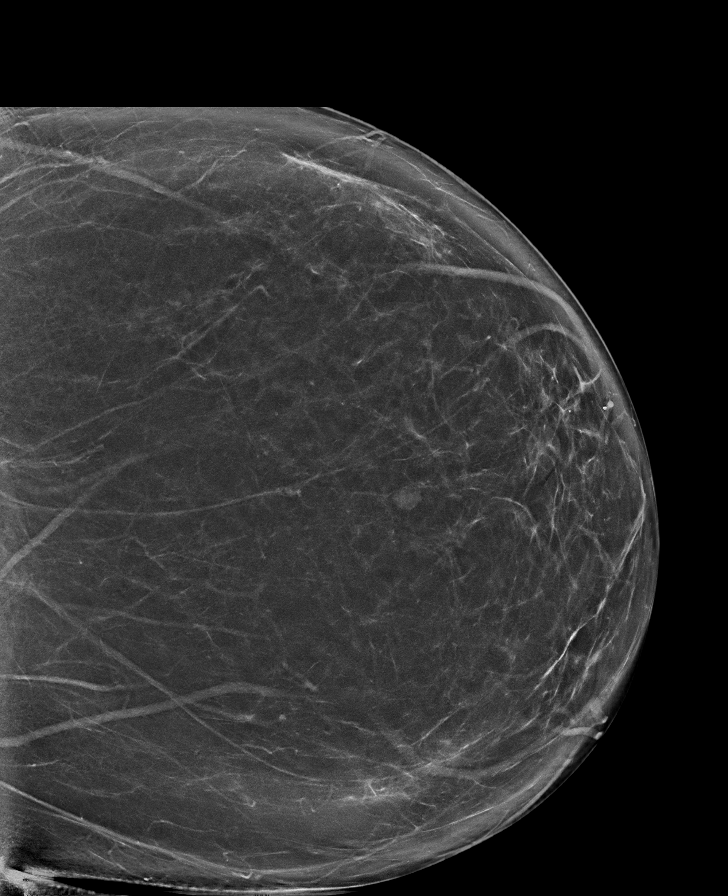

[L MLO synth-2D (2 of 2)]
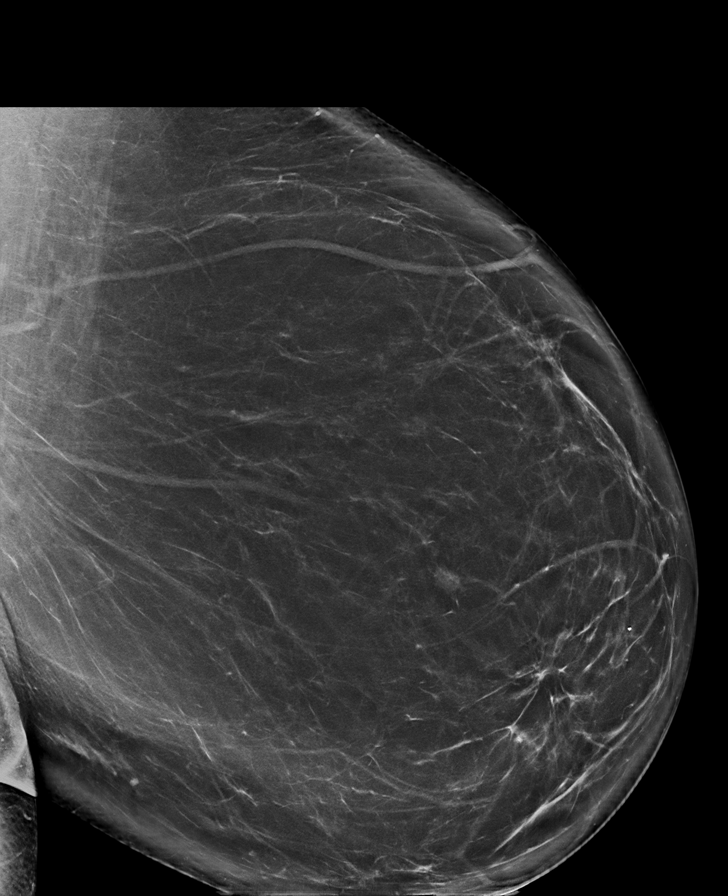

[L CC synth-2D (2 of 2)]
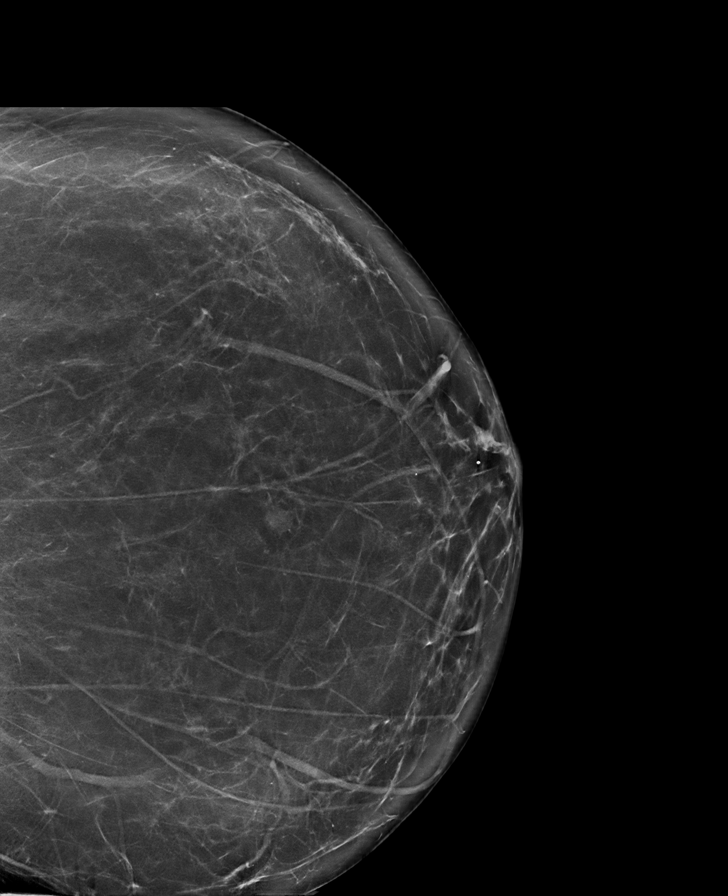

[L MLO tomo · tomo slice 70/103.0]
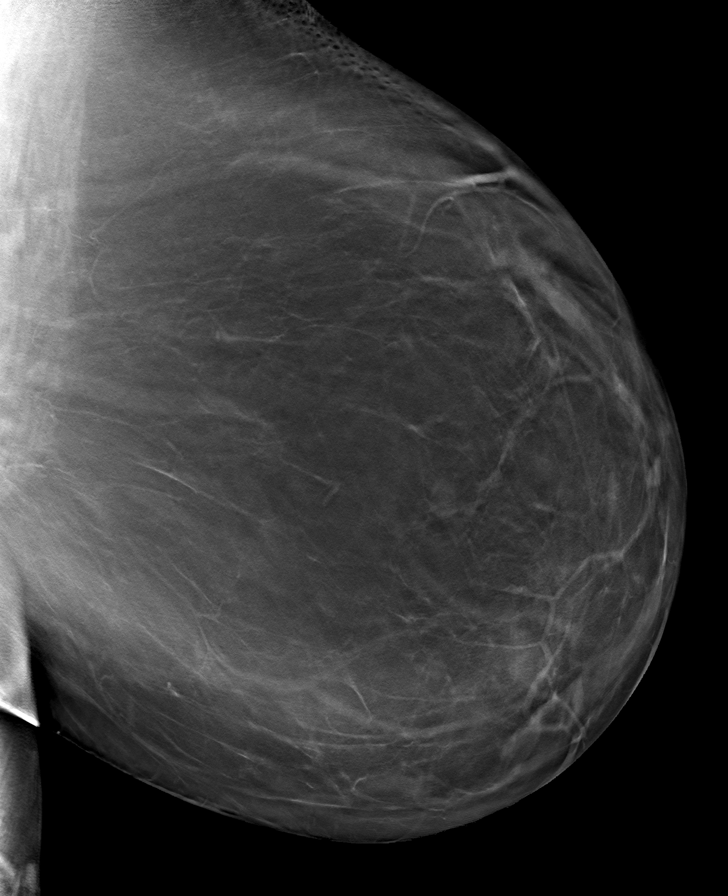

[8 of 40 positions shown; findings below may reference images not displayed]

FINDINGS: A circumscribed mass in the central left breast is mammographically
stable. Otherwise, no new or suspicious findings in either breast.

Targeted ultrasound is performed, showing a stable, anechoic mass at
the 12 o'clock position 3 cm from the nipple. Today it measures 3 x
2 x 1 mm (previously 3 x 2 x 1 mm).
IMPRESSION: 1. No mammographic evidence of malignancy in either breast.
2. Benign left breast mass demonstrating 2 year stability. No
further follow-up required.

RECOMMENDATION:
Screening mammogram in one year.(Code:Y0-P-MDN)

I have discussed the findings and recommendations with the patient.
If applicable, a reminder letter will be sent to the patient
regarding the next appointment.

BI-RADS CATEGORY  2: Benign.

## 2024-03-06 ENCOUNTER — Other Ambulatory Visit: Payer: Self-pay | Admitting: Gynecology

## 2024-03-06 DIAGNOSIS — Z1231 Encounter for screening mammogram for malignant neoplasm of breast: Secondary | ICD-10-CM

## 2024-03-14 ENCOUNTER — Ambulatory Visit
Admission: RE | Admit: 2024-03-14 | Discharge: 2024-03-14 | Disposition: A | Discharge status: Home / Self Care | Payer: Self-pay | Source: Ambulatory Visit | Attending: Gynecology | Admitting: Gynecology

## 2024-03-14 DIAGNOSIS — Z1231 Encounter for screening mammogram for malignant neoplasm of breast: Secondary | ICD-10-CM
